# Patient Record
Sex: Female | Born: 1985 | Hispanic: No | Marital: Single | Smoking: Former smoker
Health system: Southern US, Community
[De-identification: ages and names within clinical notes are randomized; demographics above are authoritative.]

## PROBLEM LIST (undated history)

## (undated) ENCOUNTER — Inpatient Hospital Stay (HOSPITAL_COMMUNITY): Payer: Self-pay

## (undated) DIAGNOSIS — F329 Major depressive disorder, single episode, unspecified: Secondary | ICD-10-CM

## (undated) DIAGNOSIS — F32A Depression, unspecified: Secondary | ICD-10-CM

## (undated) DIAGNOSIS — Z789 Other specified health status: Secondary | ICD-10-CM

## (undated) DIAGNOSIS — O139 Gestational [pregnancy-induced] hypertension without significant proteinuria, unspecified trimester: Secondary | ICD-10-CM

## (undated) DIAGNOSIS — N6009 Solitary cyst of unspecified breast: Secondary | ICD-10-CM

## (undated) DIAGNOSIS — R87629 Unspecified abnormal cytological findings in specimens from vagina: Secondary | ICD-10-CM

## (undated) DIAGNOSIS — F419 Anxiety disorder, unspecified: Secondary | ICD-10-CM

## (undated) DIAGNOSIS — B999 Unspecified infectious disease: Secondary | ICD-10-CM

## (undated) HISTORY — DX: Unspecified abnormal cytological findings in specimens from vagina: R87.629

## (undated) HISTORY — PX: BREAST BIOPSY: SHX20

## (undated) HISTORY — PX: WISDOM TOOTH EXTRACTION: SHX21

## (undated) HISTORY — DX: Anxiety disorder, unspecified: F41.9

---

## 2005-10-11 DIAGNOSIS — O139 Gestational [pregnancy-induced] hypertension without significant proteinuria, unspecified trimester: Secondary | ICD-10-CM

## 2005-10-11 HISTORY — DX: Gestational (pregnancy-induced) hypertension without significant proteinuria, unspecified trimester: O13.9

## 2007-09-01 ENCOUNTER — Ambulatory Visit: Payer: Self-pay | Admitting: *Deleted

## 2007-09-02 ENCOUNTER — Inpatient Hospital Stay (HOSPITAL_COMMUNITY): Admission: RE | Admit: 2007-09-02 | Discharge: 2007-09-03 | Payer: Self-pay | Admitting: *Deleted

## 2011-02-23 NOTE — H&P (Signed)
Joyce Rivers NO.:  [id number]   MEDICAL RECORD NO.:  [id number]          PATIENT TYPE:  IPS   LOCATION:  0604                          FACILITY:  [facility]   PHYSICIAN:  Jasmine Pang, M.D. DATE OF BIRTH:  1986-07-23   DATE OF ADMISSION:  09/02/2007  DATE OF DISCHARGE:                       PSYCHIATRIC ADMISSION ASSESSMENT   IDENTIFYING INFORMATION:  This is an involuntary admission to the  services of Dr. Milford Rivers.  This is a 25 year old single [place]-  American female.  Apparently, she discovered that her baby's father is  actually seeing a friend of hers.  This caused her to feel a lot of  pain.  She impulsively overdosed.  She took 19 Tylenol PM.  Apparently  she disclosed this to the baby's father who took her on to the [facility].  She was medically cleared there.  Her initial  acetaminophen level was 64.5.  It came down to 58.6.  She was considered  to be medically cleared.  Her urine drug screen was negative for any  other drugs or substances.  She has no history for that, and she denies  feeling depressed ordinarily.  She states this was just an impulsive  response to the news that her baby's father having a different  relationship.  She would be interested in going to therapy.   SOCIAL HISTORY:  She quit school in 11th grade.  She is currently  enrolled in [employer].  She is taking courses for her GED.  She is also active  in the [employer] program.  She has a 24 year old son, and she lives with  her aunt.  She is close to her brothers and sisters and cousins.   FAMILY HISTORY:  She denies.   ALCOHOL AND DRUG ABUSE:  She denies.   PAST MEDICAL HISTORY:  Primary care Joyce Rivers is [facility].  Medical problems:  At age 21 and 69 she had breast biopsies that turned  out to be benign.  She has no other known health issues.   MEDICATIONS:  She is not prescribed any.   ALLERGIES:  No known drug allergies.   POSITIVE  PHYSICAL FINDINGS:  PHYSICAL EXAMINATION:  She was medically  cleared in the ED at [facility].  She had no positives on her review of  systems.  Vital signs on admission show that she is 67 inches tall,  weighs 214 pounds.  Her temperature is 97.3, blood pressure is 120/86,  pulse is 76-91, respirations are 20.  She had no other remarkable  physical findings.   MENTAL STATUS EXAM:  Today, she is alert and oriented x3.  She is  appropriately groomed, dressed and nourished. Her speech is a normal  rate, rhythm and tone.  Her mood is alright.  Her affect is normal  range.  Thought processes are clear, rational and goal oriented.  Judgment and insight are good.  Concentration and memory are intact.  Intelligence is at least average.  She denies being not suicidal or  homicidal.  She denies auditory or visual hallucinations.   ADMISSION DIAGNOSES:  AXIS I:  Depressive  disorder not otherwise  specified, adjust reaction with disorder of conduct.  AXIS II:  None.  AXIS III:  None.  AXIS IV:  Impulsive response  AXIS V:  Global assessment of function is 20.   PLAN:  Admit for safety and stabilization.  She denies any need for  antidepressants.  She is going to go to therapy.  She is requesting  discharge.  We will try to set up a family session to see if everyone is  in agreement and try to identify a therapist in her area.      Joyce Rivers, P.A.-C.      Jasmine Pang, M.D.  Electronically Signed    MD/MEDQ  D:  09/02/2007  T:  09/03/2007  Job:  [id number]

## 2011-02-26 NOTE — Discharge Summary (Signed)
Joyce Rivers, Joyce Rivers NO.:  [id number]   MEDICAL RECORD NO.:  [id number]          PATIENT TYPE:  IPS   LOCATION:  0604                          FACILITY:  [facility]   PHYSICIAN:  Geoffery Lyons, M.D.      DATE OF BIRTH:  11-03-1985   DATE OF ADMISSION:  09/02/2007  DATE OF DISCHARGE:  09/03/2007                               DISCHARGE SUMMARY   CHIEF COMPLAINT AND PRESENT ILLNESS:  This was the first admission to  [facility] for this 25 year old single African  American female.  She apparently discovered that her baby's father  actually was seeing a friend of hers.  This caused her to feel a lot of  pain, impulsively overdosed, took 19 Tylenol.  She disclosed this to the  baby's father who took her to the [facility].  She was  medically cleared.  She has no history of this behavior and denied  feeling depressed ordinarily.  She said that she was just impulsively  responding to the news that her baby's father is having a different  relationship.   PAST PSYCHIATRIC HISTORY:  Denies previous treatment.   ALCOHOL AND DRUG HISTORY:  Denies active use of any substances.   MEDICAL HISTORY:  Noncontributory.   MEDICATIONS:  None.   PHYSICAL EXAMINATION:  Performed, failed to show any acute findings.   LABORATORY WORKUP:  White blood cells 7.6, hemoglobin 12.9, mean  corpuscular volume 77.1.  Urine pregnancy test negative.  Drug screen  negative.  Sodium 138, potassium 3.4, glucose 96 creatinine 0.8, SGOT  20, SGPT 14, total bilirubin 0.6.   EXAMINATION:  Reveals alert, cooperative female, appropriately groomed,  dressed and nourished.  Speech was normal in rate, tempo and production.  Mood endorsed, she was feeling all right.  Affect was broad.  Thought  processes were logical, coherent and relevant.  No evidence of  delusions.  No active suicidal or homicidal ideas, no hallucinations.  Cognition was well-preserved.   ADMITTING  DIAGNOSES:  AXIS I:  Adjustment disorder with mixed emotional  features.  AXIS II:  No diagnosis.  AXIS III:  No diagnosis.  AXIS IV:  Moderate.  AXIS V:  Global Assessment of Functioning upon admission 50, highest GAF  in the last year, 70.   COURSE IN THE HOSPITAL:  She was admitted.  She was started in  individual and group psychotherapy.  No medications were started.  As  already stated, a 25 year old female who endorsed that she took an  overdose of Tylenol secondary to conflict with her boyfriend, her son's  father.  Endorsed she thought that he was seeing another female, took 70-  20 pills, says it was impulsive.  When she realized what happened, she  was lightheaded, called the boyfriend, and he took her to the hospital.  Endorsed that she was not depressed.  She was sort of upset.  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  Has a 9-year-old son.  Family not supportive because the boyfriend she claims is [place].  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  Now that she was not  going to be with the boyfriend, said that she will probably have to go  back to work.  On November 23 she was in full contact with reality.  There were no active suicidal or homicidal ideas, no hallucinations or  delusions.  Looking forward to going home, has school the following day.  Endorsed she would have nothing to do with the boyfriend.  Has no family  support to help her through this.   DISCHARGE DIAGNOSES:  AXIS I:  Adjustment disorder with mixed emotional  features.  AXIS II:  No diagnosis.  AXIS III:  No diagnosis.  AXIS IV:  Moderate.  AXIS V:  Global Assessment of Functioning on discharge 60.   Discharged on no medications.  She will be willing to see a counselor on  an outpatient basis.      Geoffery Lyons, M.D.  Electronically Signed     IL/MEDQ  D:  10/06/2007  T:  10/07/2007  Job:  [id number]

## 2015-03-10 ENCOUNTER — Encounter (HOSPITAL_COMMUNITY): Payer: Self-pay

## 2015-03-10 ENCOUNTER — Inpatient Hospital Stay (HOSPITAL_COMMUNITY)
Admission: AD | Admit: 2015-03-10 | Discharge: 2015-03-11 | Disposition: A | Discharge status: Home / Self Care | Payer: Medicaid - Out of State | Source: Ambulatory Visit | Attending: Obstetrics and Gynecology | Admitting: Obstetrics and Gynecology

## 2015-03-10 DIAGNOSIS — Z3A00 Weeks of gestation of pregnancy not specified: Secondary | ICD-10-CM | POA: Insufficient documentation

## 2015-03-10 DIAGNOSIS — O219 Vomiting of pregnancy, unspecified: Secondary | ICD-10-CM | POA: Insufficient documentation

## 2015-03-10 DIAGNOSIS — Z87891 Personal history of nicotine dependence: Secondary | ICD-10-CM | POA: Diagnosis not present

## 2015-03-10 HISTORY — DX: Major depressive disorder, single episode, unspecified: F32.9

## 2015-03-10 HISTORY — DX: Depression, unspecified: F32.A

## 2015-03-10 HISTORY — DX: Solitary cyst of unspecified breast: N60.09

## 2015-03-10 LAB — URINALYSIS, ROUTINE W REFLEX MICROSCOPIC
Glucose, UA: NEGATIVE mg/dL
Ketones, ur: >80 mg/dL — AB
Leukocytes, UA: NEGATIVE
Nitrite: NEGATIVE
PH: 6 (ref 5.0–8.0)
PROTEIN: 100 mg/dL — AB
UROBILINOGEN UA: 1 mg/dL (ref 0.0–1.0)

## 2015-03-10 LAB — URINE MICROSCOPIC-ADD ON

## 2015-03-10 LAB — POCT PREGNANCY, URINE: PREG TEST UR: POSITIVE — AB

## 2015-03-10 MED ORDER — PROMETHAZINE HCL 25 MG/ML IJ SOLN
25.0000 mg | Freq: Once | INTRAVENOUS | Status: AC
  Administered 2015-03-10: 25 mg via INTRAVENOUS
  Filled 2015-03-10: qty 1

## 2015-03-10 MED ORDER — LACTATED RINGERS IV BOLUS (SEPSIS)
1000.0000 mL | Freq: Once | INTRAVENOUS | Status: AC
  Administered 2015-03-10: 1000 mL via INTRAVENOUS

## 2015-03-10 MED ORDER — ONDANSETRON 8 MG PO TBDP: 8.0000 mg | ORAL_TABLET | Freq: Three times a day (TID) | ORAL | Status: DC | PRN

## 2015-03-10 MED ORDER — PROMETHAZINE HCL 25 MG PO TABS: 12.5000 mg | ORAL_TABLET | Freq: Four times a day (QID) | ORAL | Status: DC | PRN

## 2015-03-10 NOTE — Discharge Instructions (Signed)
Take all medications at regular intervals. One take Metoclopramide at bedtime You may insert the phenergan tablets into the vagina or rectum if you can't take a pill   Morning Sickness Morning sickness is when you feel sick to your stomach (nauseous) during pregnancy. This nauseous feeling may or may not come with vomiting. It often occurs in the morning but can be a problem any time of day. Morning sickness is most common during the first trimester, but it may continue throughout pregnancy. While morning sickness is unpleasant, it is usually harmless unless you develop severe and continual vomiting (hyperemesis gravidarum). This condition requires more intense treatment.  CAUSES  The cause of morning sickness is not completely known but seems to be related to normal hormonal changes that occur in pregnancy. RISK FACTORS You are at greater risk if you:  Experienced nausea or vomiting before your pregnancy.  Had morning sickness during a previous pregnancy.  Are pregnant with more than one baby, such as twins. TREATMENT  Do not use any medicines (prescription, over-the-counter, or herbal) for morning sickness without first talking to your health care provider. Your health care provider may prescribe or recommend:  Vitamin B6 supplements.  Anti-nausea medicines.  The herbal medicine ginger. HOME CARE INSTRUCTIONS   Only take over-the-counter or prescription medicines as directed by your health care provider.  Taking multivitamins before getting pregnant can prevent or decrease the severity of morning sickness in most women.  Eat a piece of dry toast or unsalted crackers before getting out of bed in the morning.  Eat five or six small meals a day.  Eat dry and bland foods (rice, baked potato). Foods high in carbohydrates are often helpful.  Do not drink liquids with your meals. Drink liquids between meals.  Avoid greasy, fatty, and spicy foods.  Get someone to cook for you if  the smell of any food causes nausea and vomiting.  If you feel nauseous after taking prenatal vitamins, take the vitamins at night or with a snack.  Snack on protein foods (nuts, yogurt, cheese) between meals if you are hungry.  Eat unsweetened gelatins for desserts.  Wearing an acupressure wristband (worn for sea sickness) may be helpful.  Acupuncture may be helpful.  Do not smoke.  Get a humidifier to keep the air in your house free of odors.  Get plenty of fresh air. SEEK MEDICAL CARE IF:   Your home remedies are not working, and you need medicine.  You feel dizzy or lightheaded.  You are losing weight. SEEK IMMEDIATE MEDICAL CARE IF:   You have persistent and uncontrolled nausea and vomiting.  You pass out (faint). MAKE SURE YOU:  Understand these instructions.  Will watch your condition.  Will get help right away if you are not doing well or get worse. Document Released: 11/18/2006 Document Revised: 10/02/2013 Document Reviewed: 03/14/2013 [facility] Patient Information 2015 [place], [place]. This information is not intended to replace advice given to you by your health care provider. Make sure you discuss any questions you have with your health care provider.  Safe Medications in Pregnancy   Acne: Benzoyl Peroxide Salicylic Acid  Backache/Headache: Tylenol: 2 regular strength every 4 hours OR              2 Extra strength every 6 hours  Colds/Coughs/Allergies: Benadryl (alcohol free) 25 mg every 6 hours as needed Breath right strips Claritin Cepacol throat lozenges Chloraseptic throat spray Cold-Eeze- up to three times per day Cough drops, alcohol free Flonase (  by prescription only) Guaifenesin Mucinex Robitussin DM (plain only, alcohol free) Saline nasal spray/drops Sudafed (pseudoephedrine) & Actifed ** use only after [redacted] weeks gestation and if you do not have high blood pressure Tylenol Vicks Vaporub Zinc lozenges Zyrtec    Constipation: Colace Ducolax suppositories Fleet enema Glycerin suppositories Metamucil Milk of magnesia Miralax Senokot Smooth move tea  Diarrhea: Kaopectate Imodium A-D  *NO pepto Bismol  Hemorrhoids: Anusol Anusol HC Preparation H Tucks  Indigestion: Tums Maalox Mylanta Zantac  Pepcid  Insomnia: Benadryl (alcohol free) 25mg  every 6 hours as needed Tylenol PM Unisom, no Gelcaps  Leg Cramps: Tums MagGel  Nausea/Vomiting:  Bonine Dramamine Emetrol Ginger extract Sea bands Meclizine  Nausea medication to take during pregnancy:  Unisom (doxylamine succinate 25 mg tablets) Take one tablet daily at bedtime. If symptoms are not adequately controlled, the dose can be increased to a maximum recommended dose of two tablets daily (1/2 tablet in the morning, 1/2 tablet mid-afternoon and one at bedtime). Vitamin B6 100mg  tablets. Take one tablet twice a day (up to 200 mg per day).  Skin Rashes: Aveeno products Benadryl cream or 25mg  every 6 hours as needed Calamine Lotion 1% cortisone cream  Yeast infection: Gyne-lotrimin 7 Monistat 7   **If taking multiple medications, please check labels to avoid duplicating the same active ingredients **take medication as directed on the label ** Do not exceed 4000 mg of tylenol in 24 hours **Do not take medications that contain aspirin or ibuprofen     Prenatal Care Providers [facility]  [phone]     Phone: [place]                      [facility]  @[place]     Phone: [phone]  Phone: [place]     Phone: [phone]  Phone: [facility] [place]                hone: [phone]  Phone: [phone]         [facility] [address]acy Racer      Phone:  [phone]  Phone: [phone]         [place] [facility]                Phone: [phone]  [place])          Phone: [phone] [facility]   Phone: [phone]

## 2015-03-10 NOTE — MAU Note (Signed)
Went to [facility] on last Friday and yesterday for vomiting and nausea prescription she was given is not working.  Took a home UPT , positive and seen at [facility].  Hurts to vomit and room mate thought there was blood in the vomit.  Very constipated.  Vomited several times, lost count.  Denies vaginal bleeding.  Not having cramps but having upper abd pain like a tummy ache.

## 2015-03-10 NOTE — MAU Provider Note (Signed)
History     CSN: [id number]  Arrival date and time: 03/10/15 2004   First Provider Initiated Contact with Patient 03/10/15 2121      Chief Complaint  Patient presents with  . Emesis During Pregnancy   HPI Comments: Joyce Rivers is a 29 y.o. [id number] at Unknown who presents today with nausea and vomiting. She states that this started about a week ago. She has been to [facility], and has been given zofran and phenergan. She states that these are not helping. She denies any vaginal bleeding or cramping.   Emesis  This is a new problem. The current episode started in the past 7 days. The problem occurs more than 10 times per day. The problem has been unchanged. The emesis has an appearance of stomach contents. There has been no fever. Pertinent negatives include no abdominal pain, diarrhea or fever. Risk factors: pregnancy  Treatments tried: zofran and reglan and another med, unk name. The treatment provided no relief.    Past Medical History  Diagnosis Date  . Cyst, breast   . Depression     Past Surgical History  Procedure Laterality Date  . Breast biopsy      No family history on file.  History  Substance Use Topics  . Smoking status: Former Smoker    Quit date: 02/10/2015  . Smokeless tobacco: Never Used  . Alcohol Use: No     Comment: not while pregnant    Allergies: No Known Allergies  Prescriptions prior to admission  Medication Sig Dispense Refill Last Dose  . metoCLOPramide (REGLAN) 10 MG tablet Take 10 mg by mouth every 6 (six) hours as needed for nausea or vomiting.   03/10/2015 at Unknown time  . ondansetron (ZOFRAN) 4 MG tablet Take 4 mg by mouth every 8 (eight) hours as needed for nausea or vomiting.   03/10/2015 at Unknown time  . Prenatal Vit-Fe Fumarate-FA (PRENATAL MULTIVITAMIN) TABS tablet Take 1 tablet by mouth daily at 12 noon.   03/10/2015 at Unknown time    Review of Systems  Constitutional: Negative for fever.  Gastrointestinal: Positive for  nausea, vomiting and constipation (no BM X 1 week ). Negative for abdominal pain and diarrhea.  Genitourinary: Negative for dysuria, urgency and frequency.   Physical Exam   Blood pressure 127/82, pulse 98, resp. rate 16, height 5\' 8"  (1.727 m), weight 91.853 kg (202 lb 8 oz), last menstrual period 01/30/2015, SpO2 99 %.  Physical Exam  Nursing note and vitals reviewed. Constitutional: She is oriented to person, place, and time. She appears well-developed and well-nourished. No distress.  Cardiovascular: Normal rate.   Respiratory: Effort normal.  GI: Soft. There is no tenderness.  Neurological: She is alert and oriented to person, place, and time.  Skin: Skin is warm and dry.  Psychiatric: She has a normal mood and affect.    MAU Course  Procedures  MDM Patient has had 1L D5LR with phenergan, and 1 L of LR. She has tolerated a PO challenge.   Assessment and Plan  , 1. Nausea/vomiting in pregnancy    DC home Comfort measures reviewed  1st Trimester precautions  RX: phenergan 25mg  #30, Zofran ODT 8mg  #30 Return to MAU as needed Start [facility] as soon as possible   Follow-up Information    Schedule an appointment as soon as possible for a visit with [facility] DEPT [place].   Contact information:   [place] [phone]  Mathis Bud 03/10/2015, 9:22 PM

## 2015-03-12 ENCOUNTER — Inpatient Hospital Stay (EMERGENCY_DEPARTMENT_HOSPITAL)
Admission: AD | Admit: 2015-03-12 | Discharge: 2015-03-13 | Disposition: A | Discharge status: Home / Self Care | Payer: Medicaid - Out of State | Source: Ambulatory Visit | Attending: Obstetrics & Gynecology | Admitting: Obstetrics & Gynecology

## 2015-03-12 ENCOUNTER — Encounter (HOSPITAL_COMMUNITY): Payer: Self-pay

## 2015-03-12 DIAGNOSIS — O219 Vomiting of pregnancy, unspecified: Secondary | ICD-10-CM

## 2015-03-12 DIAGNOSIS — F329 Major depressive disorder, single episode, unspecified: Secondary | ICD-10-CM | POA: Diagnosis present

## 2015-03-12 DIAGNOSIS — O211 Hyperemesis gravidarum with metabolic disturbance: Principal | ICD-10-CM | POA: Diagnosis present

## 2015-03-12 DIAGNOSIS — Z87891 Personal history of nicotine dependence: Secondary | ICD-10-CM

## 2015-03-12 DIAGNOSIS — Z3A01 Less than 8 weeks gestation of pregnancy: Secondary | ICD-10-CM | POA: Diagnosis present

## 2015-03-12 DIAGNOSIS — O99341 Other mental disorders complicating pregnancy, first trimester: Secondary | ICD-10-CM | POA: Diagnosis present

## 2015-03-12 LAB — URINALYSIS, ROUTINE W REFLEX MICROSCOPIC
BILIRUBIN URINE: NEGATIVE
Glucose, UA: NEGATIVE mg/dL
Leukocytes, UA: NEGATIVE
Nitrite: NEGATIVE
Protein, ur: 30 mg/dL — AB
Specific Gravity, Urine: >1.03 — ABNORMAL HIGH (ref 1.005–1.030)
UROBILINOGEN UA: 1 mg/dL (ref 0.0–1.0)
pH: 6 (ref 5.0–8.0)

## 2015-03-12 LAB — CBC WITH DIFFERENTIAL/PLATELET
Basophils Absolute: 0 10*3/uL (ref 0.0–0.1)
Basophils Relative: 0 % (ref 0–1)
EOS ABS: 0.1 10*3/uL (ref 0.0–0.7)
Eosinophils Relative: 1 % (ref 0–5)
HEMATOCRIT: 36.8 % (ref 36.0–46.0)
HEMOGLOBIN: 12.9 g/dL (ref 12.0–15.0)
Lymphocytes Relative: 25 % (ref 12–46)
Lymphs Abs: 2 10*3/uL (ref 0.7–4.0)
MCH: 28.2 pg (ref 26.0–34.0)
MCHC: 35.1 g/dL (ref 30.0–36.0)
MCV: 80.5 fL (ref 78.0–100.0)
Monocytes Absolute: 0.6 10*3/uL (ref 0.1–1.0)
Monocytes Relative: 8 % (ref 3–12)
NEUTROS PCT: 66 % (ref 43–77)
Neutro Abs: 5.5 10*3/uL (ref 1.7–7.7)
Platelets: 337 10*3/uL (ref 150–400)
RBC: 4.57 MIL/uL (ref 3.87–5.11)
RDW: 12.9 % (ref 11.5–15.5)
WBC: 8.1 10*3/uL (ref 4.0–10.5)

## 2015-03-12 LAB — COMPREHENSIVE METABOLIC PANEL
ALT: 12 U/L — ABNORMAL LOW (ref 14–54)
ANION GAP: 10 (ref 5–15)
AST: 18 U/L (ref 15–41)
Albumin: 3.9 g/dL (ref 3.5–5.0)
Alkaline Phosphatase: 57 U/L (ref 38–126)
BUN: 6 mg/dL (ref 6–20)
CALCIUM: 9.3 mg/dL (ref 8.9–10.3)
CHLORIDE: 100 mmol/L — AB (ref 101–111)
CO2: 25 mmol/L (ref 22–32)
Creatinine, Ser: 0.59 mg/dL (ref 0.44–1.00)
GFR calc Af Amer: >60 mL/min (ref >60)
GFR calc non Af Amer: >60 mL/min (ref >60)
Glucose, Bld: 90 mg/dL (ref 65–99)
Potassium: 3.1 mmol/L — ABNORMAL LOW (ref 3.5–5.1)
Sodium: 135 mmol/L (ref 135–145)
TOTAL PROTEIN: 7.4 g/dL (ref 6.5–8.1)
Total Bilirubin: 1 mg/dL (ref 0.3–1.2)

## 2015-03-12 LAB — URINE MICROSCOPIC-ADD ON

## 2015-03-12 LAB — LIPASE, BLOOD: Lipase: 23 U/L (ref 22–51)

## 2015-03-12 MED ORDER — FAMOTIDINE IN NACL 20-0.9 MG/50ML-% IV SOLN
20.0000 mg | Freq: Once | INTRAVENOUS | Status: AC
  Administered 2015-03-12: 20 mg via INTRAVENOUS
  Filled 2015-03-12: qty 50

## 2015-03-12 MED ORDER — SODIUM CHLORIDE 0.9 % IV SOLN
Freq: Once | INTRAVENOUS | Status: AC
  Administered 2015-03-12: 23:00:00 via INTRAVENOUS
  Filled 2015-03-12: qty 1000

## 2015-03-12 MED ORDER — PROMETHAZINE HCL 25 MG/ML IJ SOLN
25.0000 mg | Freq: Once | INTRAMUSCULAR | Status: AC
  Administered 2015-03-12: 25 mg via INTRAVENOUS
  Filled 2015-03-12: qty 1

## 2015-03-12 NOTE — MAU Provider Note (Signed)
History     CSN: [id number]  Arrival date and time: 03/12/15 1850   First Provider Initiated Contact with Patient 03/12/15 2019      Chief Complaint  Patient presents with  . Hyperemesis Gravidarum   HPI  [name] is a 29 y.o. [id number] at [redacted]w[redacted]d who presents to MAU today with complaint of N/V. The patient states that this has been going on since she found out she was pregnant. She had the same issues with her previous pregnancy. She has been taking Reglan, Phenergan and Zofran without relief. She also has heartburn associated. She denies abdominal pain, vaginal bleeding, abnormal discharge, vaginal itching, irritation or odor. She denies fever, diarrhea or sick contacts.   OB History    Gravida Para Term Preterm AB TAB SAB Ectopic Multiple Living   3    2  2   1       Past Medical History  Diagnosis Date  . Cyst, breast   . Depression     Past Surgical History  Procedure Laterality Date  . Breast biopsy      History reviewed. No pertinent family history.  History  Substance Use Topics  . Smoking status: Former Smoker    Quit date: 02/10/2015  . Smokeless tobacco: Never Used  . Alcohol Use: No     Comment: not while pregnant    Allergies: No Known Allergies  No prescriptions prior to admission    Review of Systems  Constitutional: Negative for fever and malaise/fatigue.  Gastrointestinal: Positive for nausea and vomiting. Negative for abdominal pain, diarrhea and constipation.  Genitourinary: Negative for dysuria, urgency and frequency.       Neg - vaginal bleeding, abnormal discharge   Physical Exam   Blood pressure 100/48, pulse 87, temperature 98 F (36.7 C), resp. rate 18, height 5\' 8"  (1.727 m), weight 204 lb (92.534 kg), last menstrual period 01/30/2015.  Physical Exam  Nursing note and vitals reviewed. Constitutional: She is oriented to person, place, and time. She appears well-developed and well-nourished. No distress.  HENT:  Head:  Normocephalic and atraumatic.  Cardiovascular: Normal rate.   Respiratory: Effort normal.  GI: Soft. She exhibits no distension and no mass. There is no tenderness. There is no rebound and no guarding.  Neurological: She is alert and oriented to person, place, and time.  Skin: Skin is warm and dry. No erythema.  Psychiatric: She has a normal mood and affect.   Results for orders placed or performed during the hospital encounter of 03/12/15 (from the past 24 hour(s))  Urinalysis, Routine w reflex microscopic (not at [facility])     Status: Abnormal   Collection Time: 03/12/15  6:00 PM  Result Value Ref Range   Color, Urine YELLOW YELLOW   APPearance CLEAR CLEAR   Specific Gravity, Urine >1.030 (H) 1.005 - 1.030   pH 6.0 5.0 - 8.0   Glucose, UA NEGATIVE NEGATIVE mg/dL   Hgb urine dipstick TRACE (A) NEGATIVE   Bilirubin Urine NEGATIVE NEGATIVE   Ketones, ur >80 (A) NEGATIVE mg/dL   Protein, ur 30 (A) NEGATIVE mg/dL   Urobilinogen, UA 1.0 0.0 - 1.0 mg/dL   Nitrite NEGATIVE NEGATIVE   Leukocytes, UA NEGATIVE NEGATIVE  Urine microscopic-add on     Status: None   Collection Time: 03/12/15  6:00 PM  Result Value Ref Range   Squamous Epithelial / LPF RARE RARE   WBC, UA 0-2 <3 WBC/hpf   RBC / HPF 3-6 <3 RBC/hpf  Bacteria, UA RARE RARE  CBC with Differential/Platelet     Status: None   Collection Time: 03/12/15  8:45 PM  Result Value Ref Range   WBC 8.1 4.0 - 10.5 K/uL   RBC 4.57 3.87 - 5.11 MIL/uL   Hemoglobin 12.9 12.0 - 15.0 g/dL   HCT 36.8 36.0 - 46.0 %   MCV 80.5 78.0 - 100.0 fL   MCH 28.2 26.0 - 34.0 pg   MCHC 35.1 30.0 - 36.0 g/dL   RDW 12.9 11.5 - 15.5 %   Platelets 337 150 - 400 K/uL   Neutrophils Relative % 66 43 - 77 %   Neutro Abs 5.5 1.7 - 7.7 K/uL   Lymphocytes Relative 25 12 - 46 %   Lymphs Abs 2.0 0.7 - 4.0 K/uL   Monocytes Relative 8 3 - 12 %   Monocytes Absolute 0.6 0.1 - 1.0 K/uL   Eosinophils Relative 1 0 - 5 %   Eosinophils Absolute 0.1 0.0 - 0.7 K/uL    Basophils Relative 0 0 - 1 %   Basophils Absolute 0.0 0.0 - 0.1 K/uL  Comprehensive metabolic panel     Status: Abnormal   Collection Time: 03/12/15  8:45 PM  Result Value Ref Range   Sodium 135 135 - 145 mmol/L   Potassium 3.1 (L) 3.5 - 5.1 mmol/L   Chloride 100 (L) 101 - 111 mmol/L   CO2 25 22 - 32 mmol/L   Glucose, Bld 90 65 - 99 mg/dL   BUN 6 6 - 20 mg/dL   Creatinine, Ser 0.59 0.44 - 1.00 mg/dL   Calcium 9.3 8.9 - 10.3 mg/dL   Total Protein 7.4 6.5 - 8.1 g/dL   Albumin 3.9 3.5 - 5.0 g/dL   AST 18 15 - 41 U/L   ALT 12 (L) 14 - 54 U/L   Alkaline Phosphatase 57 38 - 126 U/L   Total Bilirubin 1.0 0.3 - 1.2 mg/dL   GFR calc non Af Amer >60 >60 mL/min   GFR calc Af Amer >60 >60 mL/min   Anion gap 10 5 - 15  Lipase, blood     Status: None   Collection Time: 03/12/15  8:45 PM  Result Value Ref Range   Lipase 23 22 - 51 U/L    MAU Course  Procedures None  MDM UA today shows significant dehydration IV LR with 25 mg Phenergan infusion given CBC, CMP and Lipase ordered CMP shows mild hypokalemia, most likely from N/V. Will replace 10 mEq in second liter of fluid with MV.  Patient able to tolerate PO in MAU. No episodes of emesis witnessed.   Assessment and Plan  A: SIUP at [redacted]w[redacted]d Nausea and vomiting in pregnancy prior to [redacted] weeks gestation  P: Discharge home Rx for Phenergan suppositories and Pepcid given to patient Diet for N/V in pregnancy discussed First trimester warning signs discussed Patient advised to follow-up with OB provider of choice to start prenatal care Patient may return to MAU as needed or if her condition were to change or worsen   Luvenia Redden, PA-C  03/13/2015, 1:20 AM

## 2015-03-12 NOTE — MAU Note (Signed)
Pt presents complaining of nausea and vomiting. States she has been seen 4 times in the ED since Friday. States all they do is give IV fluids and send her home with medications that do not work. Also wants to know if her pregnancy is ok because "no one is telling her anything." Denies vaginal bleeding or discharge.

## 2015-03-13 DIAGNOSIS — Z3A01 Less than 8 weeks gestation of pregnancy: Secondary | ICD-10-CM

## 2015-03-13 DIAGNOSIS — O219 Vomiting of pregnancy, unspecified: Secondary | ICD-10-CM

## 2015-03-13 MED ORDER — FAMOTIDINE 20 MG PO TABS
20.0000 mg | ORAL_TABLET | Freq: Two times a day (BID) | ORAL | Status: DC
Start: 2015-03-13 — End: 2015-04-01

## 2015-03-13 MED ORDER — PROMETHAZINE HCL 25 MG RE SUPP: 25.0000 mg | Freq: Four times a day (QID) | RECTAL | Status: DC | PRN

## 2015-03-13 MED ORDER — METOCLOPRAMIDE HCL 5 MG/ML IJ SOLN
10.0000 mg | Freq: Once | INTRAMUSCULAR | Status: AC
  Administered 2015-03-13: 10 mg via INTRAVENOUS
  Filled 2015-03-13: qty 2

## 2015-03-13 NOTE — Discharge Instructions (Signed)
Morning Sickness °Morning sickness is when you feel sick to your stomach (nauseous) during pregnancy. You may feel sick to your stomach and throw up (vomit). You may feel sick in the morning, but you can feel this way any time of day. Some women feel very sick to their stomach and cannot stop throwing up (hyperemesis gravidarum). °HOME CARE °· Only take medicines as told by your doctor. °· Take multivitamins as told by your doctor. Taking multivitamins before getting pregnant can stop or lessen the harshness of morning sickness. °· Eat dry toast or unsalted crackers before getting out of bed. °· Eat 5 to 6 small meals a day. °· Eat dry and bland foods like rice and baked potatoes. °· Do not drink liquids with meals. Drink between meals. °· Do not eat greasy, fatty, or spicy foods. °· Have someone cook for you if the smell of food causes you to feel sick or throw up. °· If you feel sick to your stomach after taking prenatal vitamins, take them at night or with a snack. °· Eat protein when you need a snack (nuts, yogurt, cheese). °· Eat unsweetened gelatins for dessert. °· Wear a bracelet used for sea sickness (acupressure wristband). °· Go to a doctor that puts thin needles into certain body points (acupuncture) to improve how you feel. °· Do not smoke. °· Use a humidifier to keep the air in your house free of odors. °· Get lots of fresh air. °GET HELP IF: °· You need medicine to feel better. °· You feel dizzy or lightheaded. °· You are losing weight. °GET HELP RIGHT AWAY IF:  °· You feel very sick to your stomach and cannot stop throwing up. °· You pass out (faint). °MAKE SURE YOU: °· Understand these instructions. °· Will watch your condition. °· Will get help right away if you are not doing well or get worse. °Document Released: 11/04/2004 Document Revised: 10/02/2013 Document Reviewed: 03/14/2013 °ExitCare® Patient Information ©2015 [employer]. This information is not intended to replace advice given to you by  your health care provider. Make sure you discuss any questions you have with your health care provider. ° °Eating Plan for Hyperemesis Gravidarum °Severe cases of hyperemesis gravidarum can lead to dehydration and malnutrition. The hyperemesis eating plan is one way to lessen the symptoms of nausea and vomiting. It is often used with prescribed medicines to control your symptoms.  °WHAT CAN I DO TO RELIEVE MY SYMPTOMS? °Listen to your body. Everyone is different and has different preferences. Find what works best for you. Some of the following things may help: °· Eat and drink slowly. °· Eat 5-6 small meals daily instead of 3 large meals.   °· Eat crackers before you get out of bed in the morning.   °· Starchy foods are usually well tolerated (such as cereal, toast, bread, potatoes, pasta, rice, and pretzels).   °· Ginger may help with nausea. Add ¼ tsp ground ginger to hot tea or choose ginger tea.   °· Try drinking 100% fruit juice or an electrolyte drink. °· Continue to take your prenatal vitamins as directed by your health care provider. If you are having trouble taking your prenatal vitamins, talk with your health care provider about different options. °· Include at least 1 serving of protein with your meals and snacks (such as meats or poultry, beans, nuts, eggs, or yogurt). Try eating a protein-rich snack before bed (such as cheese and crackers or a half turkey or peanut butter sandwich). °WHAT THINGS SHOULD I   AVOID TO REDUCE MY SYMPTOMS? °The following things may help reduce your symptoms: °· Avoid foods with strong smells. Try eating meals in well-ventilated areas that are free of odors. °· Avoid drinking water or other beverages with meals. Try not to drink anything less than 30 minutes before and after meals. °· Avoid drinking more than 1 cup of fluid at a time. °· Avoid fried or high-fat foods, such as butter and cream sauces. °· Avoid spicy foods. °· Avoid skipping meals the best you can. Nausea can be  more intense on an empty stomach. If you cannot tolerate food at that time, do not force it. Try sucking on ice chips or other frozen items and make up the calories later. °· Avoid lying down within 2 hours after eating. °Document Released: 07/25/2007 Document Revised: 10/02/2013 Document Reviewed: 08/01/2013 °ExitCare® Patient Information ©2015 [employer]. This information is not intended to replace advice given to you by your health care provider. Make sure you discuss any questions you have with your health care provider. ° °

## 2015-03-15 ENCOUNTER — Encounter (HOSPITAL_COMMUNITY): Payer: Self-pay | Admitting: *Deleted

## 2015-03-15 ENCOUNTER — Inpatient Hospital Stay (HOSPITAL_COMMUNITY)
Admission: AD | Admit: 2015-03-15 | Discharge: 2015-03-17 | DRG: 781 | Disposition: A | Discharge status: Home / Self Care | Payer: Medicaid - Out of State | Source: Ambulatory Visit | Attending: Obstetrics and Gynecology | Admitting: Obstetrics and Gynecology

## 2015-03-15 DIAGNOSIS — F329 Major depressive disorder, single episode, unspecified: Secondary | ICD-10-CM | POA: Diagnosis not present

## 2015-03-15 DIAGNOSIS — Z87891 Personal history of nicotine dependence: Secondary | ICD-10-CM

## 2015-03-15 DIAGNOSIS — Z3A01 Less than 8 weeks gestation of pregnancy: Secondary | ICD-10-CM | POA: Diagnosis not present

## 2015-03-15 DIAGNOSIS — O211 Hyperemesis gravidarum with metabolic disturbance: Secondary | ICD-10-CM | POA: Diagnosis not present

## 2015-03-15 DIAGNOSIS — O99341 Other mental disorders complicating pregnancy, first trimester: Secondary | ICD-10-CM | POA: Diagnosis not present

## 2015-03-15 LAB — CBC WITH DIFFERENTIAL/PLATELET
Basophils Absolute: 0 10*3/uL (ref 0.0–0.1)
Basophils Relative: 0 % (ref 0–1)
EOS ABS: 0.1 10*3/uL (ref 0.0–0.7)
Eosinophils Relative: 1 % (ref 0–5)
HCT: 35 % — ABNORMAL LOW (ref 36.0–46.0)
HEMOGLOBIN: 12.3 g/dL (ref 12.0–15.0)
LYMPHS ABS: 1.6 10*3/uL (ref 0.7–4.0)
Lymphocytes Relative: 18 % (ref 12–46)
MCH: 28.3 pg (ref 26.0–34.0)
MCHC: 35.1 g/dL (ref 30.0–36.0)
MCV: 80.5 fL (ref 78.0–100.0)
Monocytes Absolute: 0.8 10*3/uL (ref 0.1–1.0)
Monocytes Relative: 8 % (ref 3–12)
NEUTROS PCT: 73 % (ref 43–77)
Neutro Abs: 6.7 10*3/uL (ref 1.7–7.7)
PLATELETS: 322 10*3/uL (ref 150–400)
RBC: 4.35 MIL/uL (ref 3.87–5.11)
RDW: 12.9 % (ref 11.5–15.5)
WBC: 9.1 10*3/uL (ref 4.0–10.5)

## 2015-03-15 LAB — URINALYSIS, ROUTINE W REFLEX MICROSCOPIC
Bilirubin Urine: NEGATIVE
GLUCOSE, UA: NEGATIVE mg/dL
Ketones, ur: >80 mg/dL — AB
Leukocytes, UA: NEGATIVE
Nitrite: NEGATIVE
PROTEIN: 30 mg/dL — AB
Specific Gravity, Urine: >1.03 — ABNORMAL HIGH (ref 1.005–1.030)
Urobilinogen, UA: 0.2 mg/dL (ref 0.0–1.0)
pH: 6 (ref 5.0–8.0)

## 2015-03-15 LAB — URINE MICROSCOPIC-ADD ON

## 2015-03-15 LAB — COMPREHENSIVE METABOLIC PANEL WITH GFR
ALT: 10 U/L — ABNORMAL LOW (ref 14–54)
AST: 16 U/L (ref 15–41)
Albumin: 3.7 g/dL (ref 3.5–5.0)
Alkaline Phosphatase: 52 U/L (ref 38–126)
Anion gap: 8 (ref 5–15)
BUN: 5 mg/dL — ABNORMAL LOW (ref 6–20)
CO2: 25 mmol/L (ref 22–32)
Calcium: 9.4 mg/dL (ref 8.9–10.3)
Chloride: 101 mmol/L (ref 101–111)
Creatinine, Ser: 0.58 mg/dL (ref 0.44–1.00)
GFR calc Af Amer: >60 mL/min
GFR calc non Af Amer: >60 mL/min
Glucose, Bld: 84 mg/dL (ref 65–99)
Potassium: 3.1 mmol/L — ABNORMAL LOW (ref 3.5–5.1)
Sodium: 134 mmol/L — ABNORMAL LOW (ref 135–145)
Total Bilirubin: 0.6 mg/dL (ref 0.3–1.2)
Total Protein: 7.4 g/dL (ref 6.5–8.1)

## 2015-03-15 MED ORDER — FAMOTIDINE IN NACL 20-0.9 MG/50ML-% IV SOLN
20.0000 mg | Freq: Once | INTRAVENOUS | Status: AC
  Administered 2015-03-15: 20 mg via INTRAVENOUS
  Filled 2015-03-15: qty 50

## 2015-03-15 MED ORDER — POTASSIUM CHLORIDE IN NACL 20-0.9 MEQ/L-% IV SOLN
INTRAVENOUS | Status: DC
  Filled 2015-03-15 (×2): qty 1000

## 2015-03-15 MED ORDER — PROMETHAZINE HCL 25 MG/ML IJ SOLN
25.0000 mg | Freq: Once | INTRAMUSCULAR | Status: AC
  Administered 2015-03-15: 25 mg via INTRAVENOUS
  Filled 2015-03-15: qty 1

## 2015-03-15 MED ORDER — METOCLOPRAMIDE HCL 10 MG PO TABS
10.0000 mg | ORAL_TABLET | Freq: Four times a day (QID) | ORAL | Status: DC | PRN
  Administered 2015-03-16 – 2015-03-17 (×3): 10 mg via ORAL
  Filled 2015-03-15 (×3): qty 1

## 2015-03-15 MED ORDER — PROMETHAZINE HCL 25 MG RE SUPP
25.0000 mg | Freq: Four times a day (QID) | RECTAL | Status: DC | PRN
  Administered 2015-03-16: 25 mg via RECTAL
  Filled 2015-03-15: qty 1

## 2015-03-15 MED ORDER — FAMOTIDINE 20 MG PO TABS
20.0000 mg | ORAL_TABLET | Freq: Two times a day (BID) | ORAL | Status: DC
  Administered 2015-03-16 – 2015-03-17 (×4): 20 mg via ORAL
  Filled 2015-03-15 (×5): qty 1

## 2015-03-15 MED ORDER — KCL IN DEXTROSE-NACL 20-5-0.45 MEQ/L-%-% IV SOLN
INTRAVENOUS | Status: DC
Start: 2015-03-16 — End: 2015-03-17
  Administered 2015-03-16 – 2015-03-17 (×6): via INTRAVENOUS
  Filled 2015-03-15 (×18): qty 1000

## 2015-03-15 MED ORDER — PRENATAL MULTIVITAMIN CH
1.0000 | ORAL_TABLET | Freq: Every day | ORAL | Status: DC
  Administered 2015-03-16 – 2015-03-17 (×2): 1 via ORAL
  Filled 2015-03-15 (×2): qty 1

## 2015-03-15 MED ORDER — ONDANSETRON 8 MG PO TBDP
8.0000 mg | ORAL_TABLET | Freq: Three times a day (TID) | ORAL | Status: DC | PRN
  Administered 2015-03-16 – 2015-03-17 (×3): 8 mg via ORAL
  Filled 2015-03-15 (×4): qty 1

## 2015-03-15 MED ORDER — POTASSIUM CHLORIDE IN NACL 20-0.9 MEQ/L-% IV SOLN
INTRAVENOUS | Status: AC
  Administered 2015-03-15: 22:00:00 via INTRAVENOUS
  Filled 2015-03-15 (×2): qty 1000

## 2015-03-15 NOTE — MAU Note (Signed)
Vomiting for 2 wks. Some blood in emesis. Lower abd cramps and burning in mid chest. NO BM in over a wk. No vag bleeding or d/c

## 2015-03-15 NOTE — H&P (Signed)
Joyce Rivers is an 29 y.o. female.  She is a [id number] Patient's last menstrual period was 01/30/2015. She is admitted for hyperemesis with dehydration and ketonuria , after her 6th ED visit this pregnancy for N&V.  She has been tried on zofran, diclegis, and po phenergan, and has NOT tried phenergan suppositories, allegedly due to high cost (insured Pt).  She has not identified a po food that she can tolerate. Weight loss by pt report is 14 pounds, not documented to date.  Pertinent Gynecological History: Menses: LmP 01/30/15 Bleeding: none  Contraception:  DES exposure: unknown Blood transfusions: none Sexually transmitted diseases: no past history Previous GYN Procedures:   Last mammogram:  Date:  Last pap:  Date:  OB History: G3, P0021   Menstrual History: Menarche age:  Patient's last menstrual period was 01/30/2015.    Past Medical History  Diagnosis Date  . Cyst, breast   . Depression     Past Surgical History  Procedure Laterality Date  . Breast biopsy      No family history on file.  Social History:  reports that she quit smoking about 4 weeks ago. She has never used smokeless tobacco. She reports that she does not drink alcohol or use illicit drugs.  Allergies: No Known Allergies  Prescriptions prior to admission  Medication Sig Dispense Refill Last Dose  . famotidine (PEPCID) 20 MG tablet Take 1 tablet (20 mg total) by mouth 2 (two) times daily. 30 tablet 0 Past Week at Unknown time  . metoCLOPramide (REGLAN) 10 MG tablet Take 10 mg by mouth every 6 (six) hours as needed for nausea or vomiting.   Past Week at Unknown time  . ondansetron (ZOFRAN) 4 MG tablet Take 4 mg by mouth every 8 (eight) hours as needed for nausea or vomiting.   Past Week at Unknown time  . promethazine (PHENERGAN) 25 MG tablet Take 0.5-1 tablets (12.5-25 mg total) by mouth every 6 (six) hours as needed. (Patient taking differently: Take 25 mg by mouth every 6 (six) hours as needed for  nausea. ) 30 tablet 0 Past Week at Unknown time  . ondansetron (ZOFRAN ODT) 8 MG disintegrating tablet Take 1 tablet (8 mg total) by mouth every 8 (eight) hours as needed for nausea or vomiting. (Patient not taking: Reported on 03/15/2015) 30 tablet 0 03/12/2015 at Unknown time  . promethazine (PHENERGAN) 25 MG suppository Place 1 suppository (25 mg total) rectally every 6 (six) hours as needed for nausea or vomiting. (Patient not taking: Reported on 03/15/2015) 12 each 0     ROS  Blood pressure 129/87, pulse 110, resp. rate 20, height 5\' 8"  (1.727 m), weight 197 lb 6.4 oz (89.54 kg), last menstrual period 01/30/2015. Physical Exam  Constitutional: She appears well-developed.  HENT:  Head: Normocephalic.  Eyes: Pupils are equal, round, and reactive to light.  Neck: Neck supple.  Cardiovascular: Normal rate.   Respiratory: Effort normal.  GI: Soft. She exhibits no distension.  Mild epigastric tenderness s/p N & V, no guarding or rebound.   Musculoskeletal: Normal range of motion.  Neurological: She is alert.  Skin: Skin is warm and dry.  Poor skin turgor    No results found for this or any previous visit (from the past 24 hour(s)).  No results found.  Assessment/Plan: Hyperemesis gravidarum  Pregnancy 6w 2 d Admit for IV fluids, rehydration, consider steroid taper. Try PR phenergan Joyce Rivers V 03/15/2015, 8:03 PM

## 2015-03-15 NOTE — MAU Provider Note (Signed)
History     CSN: [id number]  Arrival date and time: 03/15/15 7353   First Provider Initiated Contact with Patient 03/15/15 1927      Chief Complaint  Patient presents with  . Vomiting   HPI [name] is a 29 y.o. [id number] at [redacted]w[redacted]d who presents to MAU today with complaint of N/V. The patient has been seen numerous times already throughout the pregnancy with the same complaints. She is taking Reglan, phenergan, Zofran and Pepcid. She was given Rx for Phenergan suppositories at last visit, but states that she was unable to afford them. She states none of the medications are working and she continues to have frequent N/V and heartburn. She is unable to tolerate PO today. She denies diarrhea or fever.   OB History    Gravida Para Term Preterm AB TAB SAB Ectopic Multiple Living   3    2  2   1       Past Medical History  Diagnosis Date  . Cyst, breast   . Depression     Past Surgical History  Procedure Laterality Date  . Breast biopsy      No family history on file.  History  Substance Use Topics  . Smoking status: Former Smoker    Quit date: 02/10/2015  . Smokeless tobacco: Never Used  . Alcohol Use: No     Comment: not while pregnant    Allergies: No Known Allergies  Prescriptions prior to admission  Medication Sig Dispense Refill Last Dose  . famotidine (PEPCID) 20 MG tablet Take 1 tablet (20 mg total) by mouth 2 (two) times daily. 30 tablet 0 Past Week at Unknown time  . metoCLOPramide (REGLAN) 10 MG tablet Take 10 mg by mouth every 6 (six) hours as needed for nausea or vomiting.   Past Week at Unknown time  . ondansetron (ZOFRAN) 4 MG tablet Take 4 mg by mouth every 8 (eight) hours as needed for nausea or vomiting.   Past Week at Unknown time  . promethazine (PHENERGAN) 25 MG tablet Take 0.5-1 tablets (12.5-25 mg total) by mouth every 6 (six) hours as needed. (Patient taking differently: Take 25 mg by mouth every 6 (six) hours as needed for nausea. ) 30  tablet 0 Past Week at Unknown time  . ondansetron (ZOFRAN ODT) 8 MG disintegrating tablet Take 1 tablet (8 mg total) by mouth every 8 (eight) hours as needed for nausea or vomiting. (Patient not taking: Reported on 03/15/2015) 30 tablet 0 03/12/2015 at Unknown time  . promethazine (PHENERGAN) 25 MG suppository Place 1 suppository (25 mg total) rectally every 6 (six) hours as needed for nausea or vomiting. (Patient not taking: Reported on 03/15/2015) 12 each 0     Review of Systems  Constitutional: Negative for fever and malaise/fatigue.  Gastrointestinal: Positive for nausea and vomiting. Negative for abdominal pain, diarrhea and constipation.  Genitourinary:       Neg - vaginal bleeding, discharge   Physical Exam   Blood pressure 129/87, pulse 110, resp. rate 20, height 5\' 8"  (1.727 m), weight 197 lb 6.4 oz (89.54 kg), last menstrual period 01/30/2015.  Physical Exam  Nursing note and vitals reviewed. Constitutional: She is oriented to person, place, and time. She appears well-developed and well-nourished. No distress.  HENT:  Head: Normocephalic and atraumatic.  Cardiovascular: Normal rate.   Respiratory: Effort normal.  GI: Soft. She exhibits no distension and no mass. There is no tenderness. There is no rebound and no guarding.  Neurological: She is alert and oriented to person, place, and time.  Skin: Skin is warm and dry. No erythema.  Psychiatric: She has a normal mood and affect.    MAU Course  Procedures None  MDM UA today IV LR given with 25 mg Phenergan infusion and 20 mg IVPB CBC and CMP ordered Discussed patient with Dr. Glo Herring. Due to number of recent MAU visits and failed outpatient therapy, we will admit to Women's Unit to IV hydrate and given IV anti-emetics Dr. Glo Herring to MAU to discuss admission plan with the patient.  Assessment and Plan  A: Hyperemesis Gravidarum  P: Admit to Women's Unit for IV hydration and antiemetics  Luvenia Redden, PA-C  03/15/2015,  7:55 PM

## 2015-03-16 MED ORDER — POLYETHYLENE GLYCOL 3350 17 G PO PACK
17.0000 g | PACK | Freq: Once | ORAL | Status: AC
  Administered 2015-03-16: 17 g via ORAL
  Filled 2015-03-16: qty 1

## 2015-03-16 MED ORDER — SENNA 8.6 MG PO TABS
1.0000 | ORAL_TABLET | Freq: Every evening | ORAL | Status: DC | PRN
  Filled 2015-03-16: qty 1

## 2015-03-16 NOTE — Progress Notes (Signed)
Subjective: Day 2  Of hospitalization for Hyperemesis, with no vomiting so far since admit.  Patient reports taking po liquids well. Voided once since admit.    Objective: I have reviewed patient's vital signs, intake and output, medications and labs.  Intake/Output Summary (Last 24 hours) at 03/16/15 0710 Last data filed at 03/15/15 2157  Gross per 24 hour  Intake 1615.1 ml  Output    200 ml  Net 1415.1 ml  Temp:  [98.7 F (37.1 C)-99.3 F (37.4 C)] 98.7 F (37.1 C) (06/05 0538) Pulse Rate:  [81-110] 81 (06/05 0538) Resp:  [18-22] 18 (06/05 0538) BP: (100-129)/(54-87) 100/54 mmHg (06/05 0538) SpO2:  [100 %] 100 % (06/05 0538) Weight:  [197 lb 6.4 oz (89.54 kg)] 197 lb 6.4 oz (89.54 kg) (06/04 1915)   General: alert, cooperative and no distress Resp: unlabored GI: soft, non-tender; bowel sounds normal; no masses,  no organomegaly   Assessment/Plan: Nausea and vomiting in pregnancy, HG. Advance diet to full liquids  Continue reglan/zofran/add pr phenergan Keep one more day   LOS: 1 day    Chantrell Apsey V 03/16/2015, 7:09 AM

## 2015-03-16 NOTE — Plan of Care (Signed)
Problem: Phase II Progression Outcomes Goal: Adequate hydration Outcome: Completed/Met Date Met:  03/16/15 Voiding 600cc of clear yellow urine at a time.Urine was tea colored. Goal: Pain controlled Outcome: Completed/Met Date Met:  03/16/15 Has denied pain for the last 12 hours.Did c/o chest pain on admission but it was burning from vomiting. Goal: Progress activity as tolerated unless otherwise ordered Outcome: Completed/Met Date Met:  03/16/15 Has tolerated ambulating to and from bathroom. Goal: Tolerating diet Outcome: Completed/Met Date Met:  03/16/15 Reminded to eat a bland diet,make smart food choices and to try no to let her stomach get empty.Eat small snacks. Goal: Discharge plan established Outcome: Completed/Met Date Met:  03/16/15 Nausea and vomiting at a minimum Tolerating some Regular diet Maintain weight Understands f/u and when to call MD.

## 2015-03-17 DIAGNOSIS — O211 Hyperemesis gravidarum with metabolic disturbance: Principal | ICD-10-CM

## 2015-03-17 MED ORDER — PROMETHAZINE HCL 25 MG RE SUPP: 25.0000 mg | Freq: Four times a day (QID) | RECTAL | Status: DC | PRN

## 2015-03-17 MED ORDER — ONDANSETRON 8 MG PO TBDP: 8.0000 mg | ORAL_TABLET | Freq: Three times a day (TID) | ORAL | Status: DC | PRN

## 2015-03-17 NOTE — Discharge Summary (Signed)
Physician Discharge Summary  Patient ID: Joyce Rivers MRN: [id number] DOB/AGE: 1986-04-17 29 y.o.  Admit date: 03/15/2015 Discharge date: 03/17/2015  Admission Diagnoses:[redacted]w[redacted]d Hyperemesis gravidarum  Discharge Diagnoses:  Active Problems:   Hyperemesis gravidarum before end of [redacted] week gestation, dehydration   Discharged Condition: fair Joyce Rivers is an 29 y.o. female. She is a [id number] Patient's last menstrual period was 01/30/2015. She is admitted for hyperemesis with dehydration and ketonuria , after her 6th ED visit this pregnancy for N&V.  She has been tried on zofran, diclegis, and po phenergan, and has NOT tried phenergan suppositories, allegedly due to high cost (insured Pt).  She has not identified a po food that she can tolerate. Weight loss by pt report is 14 pounds, not documented to date.  Pertinent Gynecological History: Menses: LmP 01/30/15 Bleeding: none  Contraception:  DES exposure: unknown Blood transfusions: none Sexually transmitted diseases: no past history Previous GYN Procedures:  Last mammogram: Date:  Last pap: Date:  OB History: G3, P0021  Menstrual History: Menarche age:  Patient's last menstrual period was 01/30/2015.    Past Medical History  Diagnosis Date  . Cyst, breast   . Depression     Past Surgical History  Procedure Laterality Date  . Breast biopsy      No family history on file.  Social History:  reports that she quit smoking about 4 weeks ago. She has never used smokeless tobacco. She reports that she does not drink alcohol or use illicit drugs.  Allergies: No Known Allergies  Prescriptions prior to admission  Medication Sig Dispense Refill Last Dose  . famotidine (PEPCID) 20 MG tablet Take 1 tablet (20 mg total) by mouth 2 (two) times daily. 30 tablet 0 Past Week at Unknown time  . metoCLOPramide (REGLAN) 10 MG tablet Take 10 mg by mouth every 6 (six)  hours as needed for nausea or vomiting.   Past Week at Unknown time  . ondansetron (ZOFRAN) 4 MG tablet Take 4 mg by mouth every 8 (eight) hours as needed for nausea or vomiting.   Past Week at Unknown time  . promethazine (PHENERGAN) 25 MG tablet Take 0.5-1 tablets (12.5-25 mg total) by mouth every 6 (six) hours as needed. (Patient taking differently: Take 25 mg by mouth every 6 (six) hours as needed for nausea. ) 30 tablet 0 Past Week at Unknown time  . ondansetron (ZOFRAN ODT) 8 MG disintegrating tablet Take 1 tablet (8 mg total) by mouth every 8 (eight) hours as needed for nausea or vomiting. (Patient not taking: Reported on 03/15/2015) 30 tablet 0 03/12/2015 at Unknown time  . promethazine (PHENERGAN) 25 MG suppository Place 1 suppository (25 mg total) rectally every 6 (six) hours as needed for nausea or vomiting. (Patient not taking: Reported on 03/15/2015) 12 each 0     ROS  Blood pressure 129/87, pulse 110, resp. rate 20, height 5\' 8"  (1.727 m), weight 197 lb 6.4 oz (89.54 kg), last menstrual period 01/30/2015. Physical Exam  Constitutional: She appears well-developed.  HENT:  Head: Normocephalic.  Eyes: Pupils are equal, round, and reactive to light.  Neck: Neck supple.  Cardiovascular: Normal rate.  Respiratory: Effort normal.  GI: Soft. She exhibits no distension.  Mild epigastric tenderness s/p N & V, no guarding or rebound.  Musculoskeletal: Normal range of motion.  Neurological: She is alert.  Skin: Skin is warm and dry.  Poor skin turgor     Lab Results Last 24 Hours    No results  found for this or any previous visit (from the past 24 hour(s)).     Imaging Results (Last 48 hours)    No results found.    Assessment/Plan: Hyperemesis gravidarum  Pregnancy 6w 2 d Admit for IV fluids, rehydration, consider steroid taper. Try PR phenergan FERGUSON,JOHN V 03/15/2015, 8:03 PM      Hospital Course: Patient was tolerating PO and asks for  discharge today.  Consults: None  Significant Diagnostic Studies: labs:  CBC    Component Value Date/Time   WBC 9.1 03/15/2015 2025   RBC 4.35 03/15/2015 2025   HGB 12.3 03/15/2015 2025   HCT 35.0* 03/15/2015 2025   PLT 322 03/15/2015 2025   MCV 80.5 03/15/2015 2025   MCH 28.3 03/15/2015 2025   MCHC 35.1 03/15/2015 2025   RDW 12.9 03/15/2015 2025   LYMPHSABS 1.6 03/15/2015 2025   MONOABS 0.8 03/15/2015 2025   EOSABS 0.1 03/15/2015 2025   BASOSABS 0.0 03/15/2015 2025    CMP     Component Value Date/Time   NA 134* 03/15/2015 2025   K 3.1* 03/15/2015 2025   CL 101 03/15/2015 2025   CO2 25 03/15/2015 2025   GLUCOSE 84 03/15/2015 2025   BUN 5* 03/15/2015 2025   CREATININE 0.58 03/15/2015 2025   CALCIUM 9.4 03/15/2015 2025   PROT 7.4 03/15/2015 2025   ALBUMIN 3.7 03/15/2015 2025   AST 16 03/15/2015 2025   ALT 10* 03/15/2015 2025   ALKPHOS 52 03/15/2015 2025   BILITOT 0.6 03/15/2015 2025   GFRNONAA >60 03/15/2015 2025   GFRAA >60 03/15/2015 2025      Treatments: IV hydration and  Current facility-administered medications:  .  dextrose 5 % and 0.45 % NaCl with KCl 20 mEq/L infusion, , Intravenous, Continuous, Jonnie Kind, MD, Last Rate: 200 mL/hr at 03/17/15 0404 .  famotidine (PEPCID) tablet 20 mg, 20 mg, Oral, BID, Jonnie Kind, MD, 20 mg at 03/16/15 2303 .  metoCLOPramide (REGLAN) tablet 10 mg, 10 mg, Oral, Q6H PRN, Jonnie Kind, MD, 10 mg at 03/17/15 0721 .  ondansetron (ZOFRAN-ODT) disintegrating tablet 8 mg, 8 mg, Oral, Q8H PRN, Jonnie Kind, MD, 8 mg at 03/16/15 1626 .  prenatal multivitamin tablet 1 tablet, 1 tablet, Oral, Q1200, Jonnie Kind, MD, 1 tablet at 03/16/15 1201 .  promethazine (PHENERGAN) suppository 25 mg, 25 mg, Rectal, Q6H PRN, Jonnie Kind, MD, 25 mg at 03/16/15 0819 .  senna (SENOKOT) tablet 8.6 mg, 1 tablet, Oral, QHS PRN, Governor Specking, MD   Discharge Exam: Blood pressure 100/56, pulse 79, temperature 98.8 F (37.1 C),  temperature source Oral, resp. rate 15, height 5\' 8"  (1.727 m), weight 91.173 kg (201 lb), last menstrual period 01/30/2015, SpO2 100 %. General appearance: alert, cooperative and no distress GI: soft, non-tender; bowel sounds normal; no masses,  no organomegaly  Disposition: 01-Home or Self Care     Medication List    ASK your doctor about these medications        famotidine 20 MG tablet  Commonly known as:  PEPCID  Take 1 tablet (20 mg total) by mouth 2 (two) times daily.     metoCLOPramide 10 MG tablet  Commonly known as:  REGLAN  Take 10 mg by mouth every 6 (six) hours as needed for nausea or vomiting.     ondansetron 4 MG tablet  Commonly known as:  ZOFRAN  Take 4 mg by mouth every 8 (eight) hours as needed for nausea or  vomiting.     ondansetron 8 MG disintegrating tablet  Commonly known as:  ZOFRAN ODT  Take 1 tablet (8 mg total) by mouth every 8 (eight) hours as needed for nausea or vomiting.     promethazine 25 MG tablet  Commonly known as:  PHENERGAN  Take 0.5-1 tablets (12.5-25 mg total) by mouth every 6 (six) hours as needed.     promethazine 25 MG suppository  Commonly known as:  PHENERGAN  Place 1 suppository (25 mg total) rectally every 6 (six) hours as needed for nausea or vomiting.      f/u with [facility] or [facility] for prenatal care   Signed: ARNOLD,JAMES 03/17/2015, 7:43 AM

## 2015-03-17 NOTE — Discharge Instructions (Signed)
Hyperemesis Gravidarum °Hyperemesis gravidarum is a severe form of nausea and vomiting that happens during pregnancy. Hyperemesis is worse than morning sickness. It may cause you to have nausea or vomiting all day for many days. It may keep you from eating and drinking enough food and liquids. Hyperemesis usually occurs during the first half (the first 20 weeks) of pregnancy. It often goes away once a woman is in her second half of pregnancy. However, sometimes hyperemesis continues through an entire pregnancy.  °CAUSES  °The cause of this condition is not completely known but is thought to be related to changes in the body's hormones when pregnant. It could be from the high level of the pregnancy hormone or an increase in estrogen in the body.  °SIGNS AND SYMPTOMS  °· Severe nausea and vomiting. °· Nausea that does not go away. °· Vomiting that does not allow you to keep any food down. °· Weight loss and body fluid loss (dehydration). °· Having no desire to eat or not liking food you have previously enjoyed. °DIAGNOSIS  °Your health care provider will do a physical exam and ask you about your symptoms. He or she may also order blood tests and urine tests to make sure something else is not causing the problem.  °TREATMENT  °You may only need medicine to control the problem. If medicines do not control the nausea and vomiting, you will be treated in the hospital to prevent dehydration, increased acid in the blood (acidosis), weight loss, and changes in the electrolytes in your body that may harm the unborn baby (fetus). You may need IV fluids.  °HOME CARE INSTRUCTIONS  °· Only take over-the-counter or prescription medicines as directed by your health care provider. °· Try eating a couple of dry crackers or toast in the morning before getting out of bed. °· Avoid foods and smells that upset your stomach. °· Avoid fatty and spicy foods. °· Eat 5-6 small meals a day. °· Do not drink when eating meals. Drink between  meals. °· For snacks, eat high-protein foods, such as cheese. °· Eat or suck on things that have ginger in them. Ginger helps nausea. °· Avoid food preparation. The smell of food can spoil your appetite. °· Avoid iron pills and iron in your multivitamins until after 3-4 months of being pregnant. However, consult with your health care provider before stopping any prescribed iron pills. °SEEK MEDICAL CARE IF:  °· Your abdominal pain increases. °· You have a severe headache. °· You have vision problems. °· You are losing weight. °SEEK IMMEDIATE MEDICAL CARE IF:  °· You are unable to keep fluids down. °· You vomit blood. °· You have constant nausea and vomiting. °· You have excessive weakness. °· You have extreme thirst. °· You have dizziness or fainting. °· You have a fever or persistent symptoms for more than 2-3 days. °· You have a fever and your symptoms suddenly get worse. °MAKE SURE YOU:  °· Understand these instructions. °· Will watch your condition. °· Will get help right away if you are not doing well or get worse. °Document Released: 09/27/2005 Document Revised: 07/18/2013 Document Reviewed: 05/09/2013 °[employer]® Patient Information ©2015 [employer]. This information is not intended to replace advice given to you by your health care provider. Make sure you discuss any questions you have with your health care provider. ° °

## 2015-03-21 ENCOUNTER — Encounter (HOSPITAL_COMMUNITY): Payer: Self-pay

## 2015-03-21 ENCOUNTER — Inpatient Hospital Stay (HOSPITAL_COMMUNITY)
Admission: AD | Admit: 2015-03-21 | Discharge: 2015-03-21 | Disposition: A | Discharge status: Home / Self Care | Payer: Medicaid - Out of State | Source: Ambulatory Visit | Attending: Obstetrics & Gynecology | Admitting: Obstetrics & Gynecology

## 2015-03-21 DIAGNOSIS — Z3A01 Less than 8 weeks gestation of pregnancy: Secondary | ICD-10-CM | POA: Insufficient documentation

## 2015-03-21 DIAGNOSIS — Z87891 Personal history of nicotine dependence: Secondary | ICD-10-CM | POA: Diagnosis not present

## 2015-03-21 DIAGNOSIS — O210 Mild hyperemesis gravidarum: Secondary | ICD-10-CM | POA: Insufficient documentation

## 2015-03-21 LAB — URINALYSIS, ROUTINE W REFLEX MICROSCOPIC
GLUCOSE, UA: NEGATIVE mg/dL
Nitrite: NEGATIVE
PH: 6 (ref 5.0–8.0)
Protein, ur: 100 mg/dL — AB
Specific Gravity, Urine: >1.03 — ABNORMAL HIGH (ref 1.005–1.030)
Urobilinogen, UA: 1 mg/dL (ref 0.0–1.0)

## 2015-03-21 LAB — COMPREHENSIVE METABOLIC PANEL
ALBUMIN: 4.1 g/dL (ref 3.5–5.0)
ALK PHOS: 60 U/L (ref 38–126)
ALT: 12 U/L — AB (ref 14–54)
AST: 18 U/L (ref 15–41)
Anion gap: 11 (ref 5–15)
BUN: 6 mg/dL (ref 6–20)
CHLORIDE: 100 mmol/L — AB (ref 101–111)
CO2: 24 mmol/L (ref 22–32)
CREATININE: 0.61 mg/dL (ref 0.44–1.00)
Calcium: 9.9 mg/dL (ref 8.9–10.3)
GFR calc Af Amer: >60 mL/min (ref >60)
GFR calc non Af Amer: >60 mL/min (ref >60)
Glucose, Bld: 93 mg/dL (ref 65–99)
Potassium: 3.9 mmol/L (ref 3.5–5.1)
SODIUM: 135 mmol/L (ref 135–145)
TOTAL PROTEIN: 7.8 g/dL (ref 6.5–8.1)
Total Bilirubin: 0.5 mg/dL (ref 0.3–1.2)

## 2015-03-21 LAB — URINE MICROSCOPIC-ADD ON

## 2015-03-21 MED ORDER — PROMETHAZINE HCL 25 MG/ML IJ SOLN
25.0000 mg | Freq: Once | INTRAMUSCULAR | Status: AC
  Administered 2015-03-21: 25 mg via INTRAVENOUS
  Filled 2015-03-21: qty 1

## 2015-03-21 MED ORDER — M.V.I. ADULT IV INJ
Freq: Once | INTRAVENOUS | Status: AC
  Administered 2015-03-21: 14:00:00 via INTRAVENOUS
  Filled 2015-03-21: qty 10

## 2015-03-21 NOTE — MAU Provider Note (Signed)
History     CSN: [id number]  Arrival date and time: 03/21/15 1049   None     Chief Complaint  Patient presents with  . Emesis During Pregnancy   HPI Joyce Rivers is 29 y.o. [phone] [redacted]w[redacted]d weeks presenting with persistent nausea and vomiting in first trimester pregnancy.  She was hospitalized on 6/4 for 2 days for hydration.  Went home and did well for 1 day before sxs returned.  Phenergan, Reglan and Pepcid doesn't work.  She tried Phenergan supp in the hospital but didn't like those.  Can't afford them for home use.  Vomited "I can't keep up with how many times" today.  Saw a little blood this am.  States she has lost 17 lbs since first visit for N&V.   She has appt  in the clinic at [facility] to begin prenatal care.      Past Medical History  Diagnosis Date  . Cyst, breast   . Depression     Past Surgical History  Procedure Laterality Date  . Breast biopsy      History reviewed. No pertinent family history.  History  Substance Use Topics  . Smoking status: Former Smoker    Quit date: 02/10/2015  . Smokeless tobacco: Never Used  . Alcohol Use: No     Comment: not while pregnant    Allergies: No Known Allergies  Prescriptions prior to admission  Medication Sig Dispense Refill Last Dose  . famotidine (PEPCID) 20 MG tablet Take 1 tablet (20 mg total) by mouth 2 (two) times daily. 30 tablet 0 03/21/2015 at Unknown time  . metoCLOPramide (REGLAN) 10 MG tablet Take 10 mg by mouth every 6 (six) hours as needed for nausea or vomiting.   03/21/2015 at Unknown time  . promethazine (PHENERGAN) 25 MG tablet Take 0.5-1 tablets (12.5-25 mg total) by mouth every 6 (six) hours as needed. (Patient taking differently: Take 25 mg by mouth every 6 (six) hours as needed for nausea. ) 30 tablet 0 03/20/2015 at Unknown time  . ondansetron (ZOFRAN ODT) 8 MG disintegrating tablet Take 1 tablet (8 mg total) by mouth every 8 (eight) hours as needed for nausea or vomiting. (Patient not taking:  Reported on 03/21/2015) 30 tablet 0   . promethazine (PHENERGAN) 25 MG suppository Place 1 suppository (25 mg total) rectally every 6 (six) hours as needed for nausea, vomiting or refractory nausea / vomiting. (Patient not taking: Reported on 03/21/2015) 12 each 0     Review of Systems  Constitutional: Negative for fever and chills.  Gastrointestinal: Positive for nausea and vomiting. Negative for abdominal pain.  Genitourinary: Negative for dysuria, urgency, frequency and hematuria.       Neg for vaginal bleeding  Neurological: Negative for headaches.   Physical Exam   Blood pressure 127/81, pulse 102, temperature 98.3 F (36.8 C), temperature source Oral, resp. rate 18, weight 197 lb (89.359 kg), last menstrual period 01/30/2015.  Physical Exam  Nursing note and vitals reviewed. Constitutional: She is oriented to person, place, and time. She appears well-developed and well-nourished. No distress.  Looks like she doesn't feel well.  HENT:  Head: Normocephalic.  Neck: Normal range of motion.  Cardiovascular: Normal rate.   Respiratory: Effort normal.  GI: Soft. She exhibits no distension and no mass. There is no tenderness. There is no rebound and no guarding.  Genitourinary:  Pelvic exam not indicated for today's complaint  Neurological: She is alert and oriented to person, place, and time.  Skin:  Skin is warm and dry.  Psychiatric: She has a normal mood and affect. Her behavior is normal.   Results for orders placed or performed during the hospital encounter of 03/21/15 (from the past 24 hour(s))  Urinalysis, Routine w reflex microscopic (not at [facility])     Status: Abnormal   Collection Time: 03/21/15 11:05 AM  Result Value Ref Range   Color, Urine YELLOW YELLOW   APPearance HAZY (A) CLEAR   Specific Gravity, Urine >1.030 (H) 1.005 - 1.030   pH 6.0 5.0 - 8.0   Glucose, UA NEGATIVE NEGATIVE mg/dL   Hgb urine dipstick LARGE (A) NEGATIVE   Bilirubin Urine SMALL (A) NEGATIVE    Ketones, ur >80 (A) NEGATIVE mg/dL   Protein, ur 100 (A) NEGATIVE mg/dL   Urobilinogen, UA 1.0 0.0 - 1.0 mg/dL   Nitrite NEGATIVE NEGATIVE   Leukocytes, UA TRACE (A) NEGATIVE  Urine microscopic-add on     Status: Abnormal   Collection Time: 03/21/15 11:05 AM  Result Value Ref Range   Squamous Epithelial / LPF RARE RARE   WBC, UA 3-6 <3 WBC/hpf   RBC / HPF 21-50 <3 RBC/hpf   Bacteria, UA MANY (A) RARE   Urine-Other MUCOUS PRESENT   Comprehensive metabolic panel     Status: Abnormal   Collection Time: 03/21/15  1:00 PM  Result Value Ref Range   Sodium 135 135 - 145 mmol/L   Potassium 3.9 3.5 - 5.1 mmol/L   Chloride 100 (L) 101 - 111 mmol/L   CO2 24 22 - 32 mmol/L   Glucose, Bld 93 65 - 99 mg/dL   BUN 6 6 - 20 mg/dL   Creatinine, Ser 0.61 0.44 - 1.00 mg/dL   Calcium 9.9 8.9 - 10.3 mg/dL   Total Protein 7.8 6.5 - 8.1 g/dL   Albumin 4.1 3.5 - 5.0 g/dL   AST 18 15 - 41 U/L   ALT 12 (L) 14 - 54 U/L   Alkaline Phosphatase 60 38 - 126 U/L   Total Bilirubin 0.5 0.3 - 1.2 mg/dL   GFR calc non Af Amer >60 >60 mL/min   GFR calc Af Amer >60 >60 mL/min   Anion gap 11 5 - 15   MAU Course  Procedures  MDM Labs IV Hydration with Meds--D5LR with Phenergan 25mg  IV 13:45  1st liter almost infused.  Patient is feeling better.  Will give 2nd liter with Multivits.  Try crackers.  1 liter of LR with Multivitamins infused  Patient declined crackers stating she felt better but wasn't hungry.   Ready for discharge  Assessment and Plan  A:  Hyperemesis in first trimester pregnancy      Weight loss with hyperemesis  P:  Discharge to home       Discussed OTC Vit B6 and Unisom      She also has Rx for Zofran she was given that she has not filled to take if OTC meds are not helpful     Keeps scheduled appt in the clinic  KEY,EVE M 03/21/2015, 1:35 PM

## 2015-03-21 NOTE — MAU Note (Signed)
Really sick today.  Took medication this morning, but it didn't stay down.

## 2015-03-21 NOTE — Discharge Instructions (Signed)
Eating Plan for Hyperemesis Gravidarum Severe cases of hyperemesis gravidarum can lead to dehydration and malnutrition. The hyperemesis eating plan is one way to lessen the symptoms of nausea and vomiting. It is often used with prescribed medicines to control your symptoms.  WHAT CAN I DO TO RELIEVE MY SYMPTOMS? Listen to your body. Everyone is different and has different preferences. Find what works best for you. Some of the following things may help:  Eat and drink slowly.  Eat 5-6 small meals daily instead of 3 large meals.   Eat crackers before you get out of bed in the morning.   Starchy foods are usually well tolerated (such as cereal, toast, bread, potatoes, pasta, rice, and pretzels).   Ginger may help with nausea. Add  tsp ground ginger to hot tea or choose ginger tea.   Try drinking 100% fruit juice or an electrolyte drink.  Continue to take your prenatal vitamins as directed by your health care provider. If you are having trouble taking your prenatal vitamins, talk with your health care provider about different options.  Include at least 1 serving of protein with your meals and snacks (such as meats or poultry, beans, nuts, eggs, or yogurt). Try eating a protein-rich snack before bed (such as cheese and crackers or a half [place] or peanut butter sandwich). WHAT THINGS SHOULD I AVOID TO REDUCE MY SYMPTOMS? The following things may help reduce your symptoms:  Avoid foods with strong smells. Try eating meals in well-ventilated areas that are free of odors.  Avoid drinking water or other beverages with meals. Try not to drink anything less than 30 minutes before and after meals.  Avoid drinking more than 1 cup of fluid at a time.  Avoid fried or high-fat foods, such as butter and cream sauces.  Avoid spicy foods.  Avoid skipping meals the best you can. Nausea can be more intense on an empty stomach. If you cannot tolerate food at that time, do not force it. Try sucking on  ice chips or other frozen items and make up the calories later.  Avoid lying down within 2 hours after eating. Document Released: 07/25/2007 Document Revised: 10/02/2013 Document Reviewed: 08/01/2013 [facility] Patient Information 2015 [place], [place]. This information is not intended to replace advice given to you by your health care provider. Make sure you discuss any questions you have with your health care provider.  Hyperemesis Gravidarum Hyperemesis gravidarum is a severe form of nausea and vomiting that happens during pregnancy. Hyperemesis is worse than morning sickness. It may cause you to have nausea or vomiting all day for many days. It may keep you from eating and drinking enough food and liquids. Hyperemesis usually occurs during the first half (the first 20 weeks) of pregnancy. It often goes away once a woman is in her second half of pregnancy. However, sometimes hyperemesis continues through an entire pregnancy.  CAUSES  The cause of this condition is not completely known but is thought to be related to changes in the body's hormones when pregnant. It could be from the high level of the pregnancy hormone or an increase in estrogen in the body.  SIGNS AND SYMPTOMS   Severe nausea and vomiting.  Nausea that does not go away.  Vomiting that does not allow you to keep any food down.  Weight loss and body fluid loss (dehydration).  Having no desire to eat or not liking food you have previously enjoyed. DIAGNOSIS  Your health care provider will do a physical exam  and ask you about your symptoms. He or she may also order blood tests and urine tests to make sure something else is not causing the problem.  TREATMENT  You may only need medicine to control the problem. If medicines do not control the nausea and vomiting, you will be treated in the hospital to prevent dehydration, increased acid in the blood (acidosis), weight loss, and changes in the electrolytes in your body that may harm  the unborn baby (fetus). You may need IV fluids.  HOME CARE INSTRUCTIONS   Only take over-the-counter or prescription medicines as directed by your health care provider.  Try eating a couple of dry crackers or toast in the morning before getting out of bed.  Avoid foods and smells that upset your stomach.  Avoid fatty and spicy foods.  Eat 5-6 small meals a day.  Do not drink when eating meals. Drink between meals.  For snacks, eat high-protein foods, such as cheese.  Eat or suck on things that have ginger in them. Ginger helps nausea.  Avoid food preparation. The smell of food can spoil your appetite.  Avoid iron pills and iron in your multivitamins until after 3-4 months of being pregnant. However, consult with your health care provider before stopping any prescribed iron pills. SEEK MEDICAL CARE IF:   Your abdominal pain increases.  You have a severe headache.  You have vision problems.  You are losing weight. SEEK IMMEDIATE MEDICAL CARE IF:   You are unable to keep fluids down.  You vomit blood.  You have constant nausea and vomiting.  You have excessive weakness.  You have extreme thirst.  You have dizziness or fainting.  You have a fever or persistent symptoms for more than 2-3 days.  You have a fever and your symptoms suddenly get worse. MAKE SURE YOU:   Understand these instructions.  Will watch your condition.  Will get help right away if you are not doing well or get worse. Document Released: 09/27/2005 Document Revised: 07/18/2013 Document Reviewed: 05/09/2013 [facility] Patient Information 2015 [place], [place]. This information is not intended to replace advice given to you by your health care provider. Make sure you discuss any questions you have with your health care provider. Safe Medications in Pregnancy   Acne:  Benzoyl Peroxide  Salicylic Acid   Backache/Headache:  Tylenol: 2 regular strength every 4 hours OR        2 Extra  strength every 6 hours   Colds/Coughs/Allergies:  Benadryl (alcohol free) 25 mg every 6 hours as needed  Breath right strips  Claritin  Cepacol throat lozenges  Chloraseptic throat spray  Cold-Eeze- up to three times per day  Cough drops, alcohol free  Flonase (by prescription only)  Guaifenesin  Mucinex  Robitussin DM (plain only, alcohol free)  Saline nasal spray/drops  Sudafed (pseudoephedrine) & Actifed * use only after [redacted] weeks gestation and if you do not have high blood pressure  Tylenol  Vicks Vaporub  Zinc lozenges  Zyrtec   Constipation:  Colace  Ducolax suppositories  Fleet enema  Glycerin suppositories  Metamucil  Milk of magnesia  Miralax  Senokot  Smooth move tea   Diarrhea:  Kaopectate  Imodium A-D   *NO pepto Bismol   Hemorrhoids:  Anusol  Anusol HC  Preparation H  Tucks   Indigestion:  Tums  Maalox  Mylanta  Zantac  Pepcid   Insomnia:  Benadryl (alcohol free) 25mg  every 6 hours as needed  Tylenol PM  Unisom, no  Gelcaps   Leg Cramps:  Tums  MagGel   Nausea/Vomiting:  Bonine  Dramamine  Emetrol  Ginger extract  Sea bands  Meclizine  Nausea medication to take during pregnancy:  Unisom (doxylamine succinate 25 mg tablets) Take one tablet daily at bedtime. If symptoms are not adequately controlled, the dose can be increased to a maximum recommended dose of two tablets daily (1/2 tablet in the morning, 1/2 tablet mid-afternoon and one at bedtime).  Vitamin B6 100mg  tablets. Take one tablet twice a day (up to 200 mg per day).   Skin Rashes:  Aveeno products  Benadryl cream or 25mg  every 6 hours as needed  Calamine Lotion  1% cortisone cream   Yeast infection:  Gyne-lotrimin 7  Monistat 7    **If taking multiple medications, please check labels to avoid duplicating the same active ingredients  **take medication as directed on the label  ** Do not exceed 4000 mg of tylenol in 24 hours  **Do not take medications that contain  aspirin or ibuprofen

## 2015-03-24 ENCOUNTER — Inpatient Hospital Stay (HOSPITAL_COMMUNITY)
Admission: AD | Admit: 2015-03-24 | Discharge: 2015-03-25 | Disposition: A | Discharge status: Home / Self Care | Payer: Medicaid Other | Source: Ambulatory Visit | Attending: Obstetrics and Gynecology | Admitting: Obstetrics and Gynecology

## 2015-03-24 ENCOUNTER — Encounter (HOSPITAL_COMMUNITY): Payer: Self-pay

## 2015-03-24 DIAGNOSIS — O211 Hyperemesis gravidarum with metabolic disturbance: Secondary | ICD-10-CM

## 2015-03-24 DIAGNOSIS — O218 Other vomiting complicating pregnancy: Secondary | ICD-10-CM | POA: Diagnosis not present

## 2015-03-24 DIAGNOSIS — O210 Mild hyperemesis gravidarum: Secondary | ICD-10-CM | POA: Insufficient documentation

## 2015-03-24 DIAGNOSIS — Z3A01 Less than 8 weeks gestation of pregnancy: Secondary | ICD-10-CM | POA: Insufficient documentation

## 2015-03-24 DIAGNOSIS — Z87891 Personal history of nicotine dependence: Secondary | ICD-10-CM | POA: Diagnosis not present

## 2015-03-24 HISTORY — DX: Gestational (pregnancy-induced) hypertension without significant proteinuria, unspecified trimester: O13.9

## 2015-03-24 LAB — URINALYSIS, ROUTINE W REFLEX MICROSCOPIC
Glucose, UA: NEGATIVE mg/dL
Ketones, ur: >80 mg/dL — AB
Nitrite: NEGATIVE
PROTEIN: 100 mg/dL — AB
Specific Gravity, Urine: >1.03 — ABNORMAL HIGH (ref 1.005–1.030)
UROBILINOGEN UA: 1 mg/dL (ref 0.0–1.0)
pH: 6 (ref 5.0–8.0)

## 2015-03-24 LAB — URINE MICROSCOPIC-ADD ON

## 2015-03-24 MED ORDER — LACTATED RINGERS IV BOLUS (SEPSIS)
1000.0000 mL | Freq: Once | INTRAVENOUS | Status: AC
  Administered 2015-03-24: 1000 mL via INTRAVENOUS

## 2015-03-24 MED ORDER — ONDANSETRON 8 MG PO TBDP
8.0000 mg | ORAL_TABLET | Freq: Once | ORAL | Status: AC
  Administered 2015-03-24: 8 mg via ORAL
  Filled 2015-03-24: qty 1

## 2015-03-24 MED ORDER — PROMETHAZINE HCL 25 MG/ML IJ SOLN
25.0000 mg | Freq: Once | INTRAVENOUS | Status: AC
  Administered 2015-03-24: 25 mg via INTRAVENOUS
  Filled 2015-03-24: qty 1

## 2015-03-24 NOTE — MAU Provider Note (Signed)
History     CSN: [id number]  Arrival date and time: 03/24/15 2059   First Provider Initiated Contact with Patient 03/24/15 2211      Chief Complaint  Patient presents with  . Hyperemesis Gravidarum   HPI Comments: Joyce Rivers is a 29 y.o. [phone] at [redacted]w[redacted]d who presents today with nausea and vomiting. She states that this has been ongoing since she found out she was pregnant. She has tried reglan, phenergan and diclegis. She has also tried zofran ODT. She states that the ODT works for her, but she was given non-ODT zofran. She states that she is unable to keep that down.   Emesis  This is a new problem. The current episode started more than 1 month ago. The problem occurs more than 10 times per day. The problem has been unchanged. The emesis has an appearance of stomach contents. There has been no fever. Pertinent negatives include no abdominal pain, diarrhea or fever. Risk factors: pregnant. Treatments tried: phenergan, reglan and diclegis     Past Medical History  Diagnosis Date  . Cyst, breast   . Depression   . Pregnancy induced hypertension 2007    Past Surgical History  Procedure Laterality Date  . Breast biopsy      History reviewed. No pertinent family history.  History  Substance Use Topics  . Smoking status: Former Smoker    Quit date: 02/10/2015  . Smokeless tobacco: Never Used  . Alcohol Use: No     Comment: not while pregnant    Allergies: No Known Allergies  Prescriptions prior to admission  Medication Sig Dispense Refill Last Dose  . Doxylamine Succinate, Sleep, (UNISOM PO) Take 1 tablet by mouth 2 (two) times daily as needed (take 1/2 tablet in the morning and 1/2 tablet before bedtime.).   03/23/2015 at Unknown time  . famotidine (PEPCID) 20 MG tablet Take 1 tablet (20 mg total) by mouth 2 (two) times daily. 30 tablet 0 03/23/2015 at Unknown time  . metoCLOPramide (REGLAN) 10 MG tablet Take 10 mg by mouth every 6 (six) hours as needed for nausea or  vomiting.   03/24/2015 at Unknown time  . promethazine (PHENERGAN) 25 MG tablet Take 0.5-1 tablets (12.5-25 mg total) by mouth every 6 (six) hours as needed. (Patient taking differently: Take 25 mg by mouth every 6 (six) hours as needed for nausea. ) 30 tablet 0 Past Week at Unknown time  . pyridoxine (B-6) 200 MG tablet Take 100 mg by mouth 2 (two) times daily.   03/24/2015 at Unknown time    Review of Systems  Constitutional: Negative for fever.  Gastrointestinal: Positive for nausea and vomiting. Negative for abdominal pain, diarrhea and constipation.  Genitourinary: Negative for dysuria, urgency and frequency.   Physical Exam   Blood pressure 127/79, pulse 105, temperature 98.6 F (37 C), temperature source Oral, resp. rate 18, height 5\' 8"  (1.727 m), weight 89.449 kg (197 lb 3.2 oz), last menstrual period 01/30/2015, SpO2 100 %.  Physical Exam  Nursing note and vitals reviewed. Constitutional: She is oriented to person, place, and time. She appears well-developed and well-nourished. No distress.  HENT:  Head: Normocephalic.  Cardiovascular: Normal rate.   Respiratory: Effort normal.  GI: Soft. There is no tenderness. There is no rebound.  Neurological: She is alert and oriented to person, place, and time.  Skin: Skin is warm and dry.  Psychiatric: She has a normal mood and affect.    Results for orders placed or performed during  the hospital encounter of 03/24/15 (from the past 48 hour(s))  Urinalysis, Routine w reflex microscopic (not at [facility])     Status: Abnormal   Collection Time: 03/24/15  9:19 PM  Result Value Ref Range   Color, Urine YELLOW YELLOW   APPearance TURBID (A) CLEAR   Specific Gravity, Urine >1.030 (H) 1.005 - 1.030   pH 6.0 5.0 - 8.0   Glucose, UA NEGATIVE NEGATIVE mg/dL   Hgb urine dipstick LARGE (A) NEGATIVE   Bilirubin Urine SMALL (A) NEGATIVE   Ketones, ur >80 (A) NEGATIVE mg/dL   Protein, ur 100 (A) NEGATIVE mg/dL   Urobilinogen, UA 1.0 0.0 - 1.0  mg/dL   Nitrite NEGATIVE NEGATIVE   Leukocytes, UA TRACE (A) NEGATIVE  Urine microscopic-add on     Status: Abnormal   Collection Time: 03/24/15  9:19 PM  Result Value Ref Range   Squamous Epithelial / LPF FEW (A) RARE   WBC, UA 0-2 <3 WBC/hpf   RBC / HPF TOO NUMEROUS TO COUNT <3 RBC/hpf   Bacteria, UA FEW (A) RARE   Urine-Other MUCOUS PRESENT     MAU Course  Procedures  MDM Patient has had 1L of D5LR with phenergan and 1L of LR. She is tolerating PO at this time.   Assessment and Plan   1. Hyperemesis gravidarum before end of [redacted] week gestation, dehydration    DC home Comfort measures reviewed  1st Trimester precautions  Bleeding precautions RX: zofran ODT, Robinul Return to MAU as needed FU with OB as planned  Follow-up Information    Schedule an appointment as soon as possible for a visit with [facility] [place].   Contact information:   [place] [address] [phone]      Mathis Bud 03/24/2015, 10:12 PM

## 2015-03-24 NOTE — MAU Note (Signed)
Has not stopped vomiting since being in MAU the other day. Has lost count of how many times she has vomited in the last 24 hours. Unable to keep down liquid or solids. Denies vaginal bleeding. Denies lower abdominal pain. Some upper abdominal cramping.  Denies diarrhea. Denies fever.

## 2015-03-24 NOTE — MAU Note (Signed)
Pt reports she was given zofran ODT in early pregnancy. She has the oral zofran now which she says does not help her.  She finds better relief from the ODT zofran.  She could not keep down the po phenergan and said she wasn't informed that the pill could be inserted the vagina.

## 2015-03-25 DIAGNOSIS — O218 Other vomiting complicating pregnancy: Secondary | ICD-10-CM

## 2015-03-25 DIAGNOSIS — Z3A01 Less than 8 weeks gestation of pregnancy: Secondary | ICD-10-CM

## 2015-03-25 MED ORDER — ONDANSETRON 8 MG PO TBDP
8.0000 mg | ORAL_TABLET | Freq: Three times a day (TID) | ORAL | Status: DC | PRN
Start: 2015-03-25 — End: 2015-04-01

## 2015-03-25 MED ORDER — GLYCOPYRROLATE 1 MG PO TABS: 1.0000 mg | ORAL_TABLET | Freq: Two times a day (BID) | ORAL | Status: DC

## 2015-03-25 NOTE — Discharge Instructions (Signed)
Eating Plan for Hyperemesis Gravidarum Severe cases of hyperemesis gravidarum can lead to dehydration and malnutrition. The hyperemesis eating plan is one way to lessen the symptoms of nausea and vomiting. It is often used with prescribed medicines to control your symptoms.  WHAT CAN I DO TO RELIEVE MY SYMPTOMS? Listen to your body. Everyone is different and has different preferences. Find what works best for you. Some of the following things may help:  Eat and drink slowly.  Eat 5-6 small meals daily instead of 3 large meals.   Eat crackers before you get out of bed in the morning.   Starchy foods are usually well tolerated (such as cereal, toast, bread, potatoes, pasta, rice, and pretzels).   Ginger may help with nausea. Add  tsp ground ginger to hot tea or choose ginger tea.   Try drinking 100% fruit juice or an electrolyte drink.  Continue to take your prenatal vitamins as directed by your health care provider. If you are having trouble taking your prenatal vitamins, talk with your health care provider about different options.  Include at least 1 serving of protein with your meals and snacks (such as meats or poultry, beans, nuts, eggs, or yogurt). Try eating a protein-rich snack before bed (such as cheese and crackers or a half [place] or peanut butter sandwich). WHAT THINGS SHOULD I AVOID TO REDUCE MY SYMPTOMS? The following things may help reduce your symptoms:  Avoid foods with strong smells. Try eating meals in well-ventilated areas that are free of odors.  Avoid drinking water or other beverages with meals. Try not to drink anything less than 30 minutes before and after meals.  Avoid drinking more than 1 cup of fluid at a time.  Avoid fried or high-fat foods, such as butter and cream sauces.  Avoid spicy foods.  Avoid skipping meals the best you can. Nausea can be more intense on an empty stomach. If you cannot tolerate food at that time, do not force it. Try sucking on  ice chips or other frozen items and make up the calories later.  Avoid lying down within 2 hours after eating. Document Released: 07/25/2007 Document Revised: 10/02/2013 Document Reviewed: 08/01/2013 [facility] Patient Information 2015 [place], [place]. This information is not intended to replace advice given to you by your health care provider. Make sure you discuss any questions you have with your health care provider.

## 2015-03-31 ENCOUNTER — Encounter (HOSPITAL_COMMUNITY): Payer: Self-pay | Admitting: *Deleted

## 2015-03-31 ENCOUNTER — Inpatient Hospital Stay (HOSPITAL_COMMUNITY)
Admission: AD | Admit: 2015-03-31 | Discharge: 2015-03-31 | Disposition: A | Discharge status: Home / Self Care | Payer: Medicaid Other | Source: Ambulatory Visit | Attending: Obstetrics and Gynecology | Admitting: Obstetrics and Gynecology

## 2015-03-31 DIAGNOSIS — Z87891 Personal history of nicotine dependence: Secondary | ICD-10-CM | POA: Insufficient documentation

## 2015-03-31 DIAGNOSIS — Z3A08 8 weeks gestation of pregnancy: Secondary | ICD-10-CM | POA: Insufficient documentation

## 2015-03-31 DIAGNOSIS — O211 Hyperemesis gravidarum with metabolic disturbance: Secondary | ICD-10-CM | POA: Diagnosis not present

## 2015-03-31 DIAGNOSIS — O210 Mild hyperemesis gravidarum: Secondary | ICD-10-CM | POA: Insufficient documentation

## 2015-03-31 HISTORY — DX: Other specified health status: Z78.9

## 2015-03-31 LAB — URINALYSIS, ROUTINE W REFLEX MICROSCOPIC
Glucose, UA: NEGATIVE mg/dL
Ketones, ur: >80 mg/dL — AB
Nitrite: NEGATIVE
PH: 6.5 (ref 5.0–8.0)
PROTEIN: 30 mg/dL — AB
Specific Gravity, Urine: >1.03 — ABNORMAL HIGH (ref 1.005–1.030)
Urobilinogen, UA: 2 mg/dL — ABNORMAL HIGH (ref 0.0–1.0)

## 2015-03-31 LAB — URINE MICROSCOPIC-ADD ON

## 2015-03-31 MED ORDER — GI COCKTAIL ~~LOC~~
30.0000 mL | Freq: Once | ORAL | Status: AC
  Administered 2015-03-31: 30 mL via ORAL
  Filled 2015-03-31: qty 30

## 2015-03-31 MED ORDER — SODIUM CHLORIDE 0.9 % IV SOLN
25.0000 mg | Freq: Once | INTRAVENOUS | Status: AC
  Administered 2015-03-31: 25 mg via INTRAVENOUS
  Filled 2015-03-31: qty 1

## 2015-03-31 MED ORDER — DEXTROSE 5 % IN LACTATED RINGERS IV BOLUS
1000.0000 mL | Freq: Once | INTRAVENOUS | Status: AC
  Administered 2015-03-31: 1000 mL via INTRAVENOUS

## 2015-03-31 MED ORDER — SODIUM CHLORIDE 0.9 % IV SOLN
8.0000 mg | Freq: Once | INTRAVENOUS | Status: AC
  Administered 2015-03-31: 8 mg via INTRAVENOUS
  Filled 2015-03-31: qty 4

## 2015-03-31 NOTE — MAU Note (Signed)
Was here last wk, has not been able to keep any food or liquids down since Saturday.  Has been in several times for hyperemesis.  Today has just been really bad.

## 2015-03-31 NOTE — MAU Provider Note (Signed)
History     CSN: [id number]  Arrival date and time: 03/31/15 1330   First Provider Initiated Contact with Patient 03/31/15 1348      Chief Complaint  Patient presents with  . Emesis During Pregnancy   HPI  Patient is 28 y.o. [id number] [redacted]w[redacted]d here with complaints of nausea and vomiting.  Worsening since Saturday, has lost count of how much she has vomited or daily how often she vomits.  Nothing improves.  Currently taking zofran ODT and pepcid, takes phenergan at night qHS.   Past Medical History  Diagnosis Date  . Cyst, breast   . Depression   . Pregnancy induced hypertension 2007  . Medical history non-contributory     Past Surgical History  Procedure Laterality Date  . Breast biopsy      Family History  Problem Relation Age of Onset  . Asthma Sister   . Asthma Son   . Cancer Paternal Grandmother     breast  . Heart disease Paternal Grandmother   . Diabetes Neg Hx   . Hypertension Neg Hx     History  Substance Use Topics  . Smoking status: Former Smoker    Quit date: 02/10/2015  . Smokeless tobacco: Never Used  . Alcohol Use: No     Comment: not while pregnant    Allergies: No Known Allergies  Prescriptions prior to admission  Medication Sig Dispense Refill Last Dose  . Doxylamine Succinate, Sleep, (UNISOM PO) Take 1 tablet by mouth 2 (two) times daily as needed (take 1/2 tablet in the morning and 1/2 tablet before bedtime.).   03/23/2015 at Unknown time  . famotidine (PEPCID) 20 MG tablet Take 1 tablet (20 mg total) by mouth 2 (two) times daily. 30 tablet 0 03/23/2015 at Unknown time  . glycopyrrolate (ROBINUL) 1 MG tablet Take 1 tablet (1 mg total) by mouth 2 (two) times daily. 60 tablet 0   . metoCLOPramide (REGLAN) 10 MG tablet Take 10 mg by mouth every 6 (six) hours as needed for nausea or vomiting.   03/24/2015 at Unknown time  . ondansetron (ZOFRAN ODT) 8 MG disintegrating tablet Take 1 tablet (8 mg total) by mouth every 8 (eight) hours as needed for  nausea or vomiting. 20 tablet 2   . promethazine (PHENERGAN) 25 MG tablet Take 0.5-1 tablets (12.5-25 mg total) by mouth every 6 (six) hours as needed. (Patient taking differently: Take 25 mg by mouth every 6 (six) hours as needed for nausea. ) 30 tablet 0 Past Week at Unknown time  . pyridoxine (B-6) 200 MG tablet Take 100 mg by mouth 2 (two) times daily.   03/24/2015 at Unknown time    Review of Systems  Constitutional: Negative for fever, chills and diaphoresis.  HENT: Negative for congestion.   Respiratory: Negative for cough and shortness of breath.   Cardiovascular: Negative for chest pain (after vomiting, none exertional) and leg swelling.  Gastrointestinal: Positive for nausea and vomiting. Negative for heartburn and diarrhea.  Genitourinary: Negative for dysuria, urgency, frequency and hematuria.  Skin: Negative for itching and rash.  Neurological: Negative for dizziness, loss of consciousness and headaches.   Physical Exam   Blood pressure 123/77, pulse 111, temperature 98.6 F (37 C), temperature source Oral, resp. rate 18, weight 196 lb (88.905 kg), last menstrual period 01/30/2015.  Physical Exam  MAU Course  Procedures  MDM Peripheral IV Phenergan infusion zofran 8mg  IV PO challenge  F/u at 3:16 PM => still receiving phenergan infusion, s/p GI cocktail  and zofran 8mg  IV => feeling somewhat better  F/u 4:16 PM => symptoms imprved s/p all above.    Assessment and Plan  Patient is 29 y.o. [id number] [redacted]w[redacted]d reporting nausea and vomiting likely secondary to hyperemesis gravidarum  - sono as outpatient given no pain, has not had TSH either.  Will need at initial OB, already has appointment. - tolerating PO prior to discharge.  Kapil Petropoulos ROCIO 03/31/2015, 1:49 PM

## 2015-04-01 ENCOUNTER — Inpatient Hospital Stay (HOSPITAL_COMMUNITY)
Admission: AD | Admit: 2015-04-01 | Discharge: 2015-04-01 | Disposition: A | Discharge status: Home / Self Care | Payer: Medicaid Other | Source: Ambulatory Visit | Attending: Family Medicine | Admitting: Family Medicine

## 2015-04-01 ENCOUNTER — Encounter (HOSPITAL_COMMUNITY): Payer: Self-pay | Admitting: *Deleted

## 2015-04-01 DIAGNOSIS — O99281 Endocrine, nutritional and metabolic diseases complicating pregnancy, first trimester: Secondary | ICD-10-CM | POA: Diagnosis not present

## 2015-04-01 DIAGNOSIS — O210 Mild hyperemesis gravidarum: Secondary | ICD-10-CM | POA: Insufficient documentation

## 2015-04-01 DIAGNOSIS — E0590 Thyrotoxicosis, unspecified without thyrotoxic crisis or storm: Secondary | ICD-10-CM | POA: Insufficient documentation

## 2015-04-01 DIAGNOSIS — Z3A08 8 weeks gestation of pregnancy: Secondary | ICD-10-CM | POA: Diagnosis not present

## 2015-04-01 DIAGNOSIS — O211 Hyperemesis gravidarum with metabolic disturbance: Secondary | ICD-10-CM | POA: Diagnosis not present

## 2015-04-01 DIAGNOSIS — E079 Disorder of thyroid, unspecified: Secondary | ICD-10-CM | POA: Diagnosis not present

## 2015-04-01 LAB — COMPREHENSIVE METABOLIC PANEL
ALT: 11 U/L — ABNORMAL LOW (ref 14–54)
AST: 16 U/L (ref 15–41)
Albumin: 3.5 g/dL (ref 3.5–5.0)
Alkaline Phosphatase: 52 U/L (ref 38–126)
Anion gap: 7 (ref 5–15)
BILIRUBIN TOTAL: 0.5 mg/dL (ref 0.3–1.2)
CO2: 22 mmol/L (ref 22–32)
CREATININE: 0.46 mg/dL (ref 0.44–1.00)
Calcium: 9.1 mg/dL (ref 8.9–10.3)
Chloride: 105 mmol/L (ref 101–111)
GFR calc Af Amer: >60 mL/min (ref >60)
Glucose, Bld: 90 mg/dL (ref 65–99)
Potassium: 3.4 mmol/L — ABNORMAL LOW (ref 3.5–5.1)
Sodium: 134 mmol/L — ABNORMAL LOW (ref 135–145)
Total Protein: 7 g/dL (ref 6.5–8.1)

## 2015-04-01 LAB — CBC
HEMATOCRIT: 33.2 % — AB (ref 36.0–46.0)
Hemoglobin: 11.4 g/dL — ABNORMAL LOW (ref 12.0–15.0)
MCH: 27.5 pg (ref 26.0–34.0)
MCHC: 34.3 g/dL (ref 30.0–36.0)
MCV: 80.2 fL (ref 78.0–100.0)
PLATELETS: 323 10*3/uL (ref 150–400)
RBC: 4.14 MIL/uL (ref 3.87–5.11)
RDW: 13.3 % (ref 11.5–15.5)
WBC: 6.2 10*3/uL (ref 4.0–10.5)

## 2015-04-01 LAB — URINALYSIS, ROUTINE W REFLEX MICROSCOPIC
Bilirubin Urine: NEGATIVE
Glucose, UA: NEGATIVE mg/dL
Ketones, ur: >80 mg/dL — AB
NITRITE: NEGATIVE
Protein, ur: 30 mg/dL — AB
SPECIFIC GRAVITY, URINE: 1.02 (ref 1.005–1.030)
UROBILINOGEN UA: 2 mg/dL — AB (ref 0.0–1.0)
pH: 8 (ref 5.0–8.0)

## 2015-04-01 LAB — URINE MICROSCOPIC-ADD ON

## 2015-04-01 LAB — TSH: TSH: 0.157 u[IU]/mL — ABNORMAL LOW (ref 0.350–4.500)

## 2015-04-01 LAB — T4, FREE: Free T4: 1.02 ng/dL (ref 0.61–1.12)

## 2015-04-01 MED ORDER — SODIUM CHLORIDE 0.9 % IV SOLN
8.0000 mg | Freq: Once | INTRAVENOUS | Status: AC
  Administered 2015-04-01: 8 mg via INTRAVENOUS
  Filled 2015-04-01: qty 4

## 2015-04-01 MED ORDER — FAMOTIDINE 20 MG PO TABS: 20.0000 mg | ORAL_TABLET | Freq: Two times a day (BID) | ORAL | Status: DC

## 2015-04-01 MED ORDER — DEXTROSE 5 % IN LACTATED RINGERS IV BOLUS
1000.0000 mL | Freq: Once | INTRAVENOUS | Status: AC
  Administered 2015-04-01: 1000 mL via INTRAVENOUS

## 2015-04-01 MED ORDER — PROMETHAZINE HCL 25 MG PO TABS
25.0000 mg | ORAL_TABLET | Freq: Four times a day (QID) | ORAL | Status: DC | PRN
Start: 2015-04-01 — End: 2015-04-16

## 2015-04-01 MED ORDER — ONDANSETRON 8 MG PO TBDP
8.0000 mg | ORAL_TABLET | Freq: Three times a day (TID) | ORAL | Status: DC | PRN
Start: 2015-04-01 — End: 2015-04-16

## 2015-04-01 MED ORDER — PROMETHAZINE HCL 25 MG RE SUPP: 25.0000 mg | Freq: Four times a day (QID) | RECTAL | Status: DC | PRN

## 2015-04-01 MED ORDER — PROMETHAZINE HCL 25 MG/ML IJ SOLN
25.0000 mg | Freq: Once | INTRAVENOUS | Status: AC
  Administered 2015-04-01: 25 mg via INTRAVENOUS
  Filled 2015-04-01: qty 1

## 2015-04-01 NOTE — MAU Note (Signed)
Pt presents to MAU with complaints of nausea and vomiting, not able to keep anything down. Has been given zofran but states it is not working

## 2015-04-01 NOTE — Discharge Instructions (Signed)
°  Take Phenergan and Zofran on schedule. Do not wait until the nausea and vomiting have returned before you start taking her medicines again.   Eating Plan for Hyperemesis Gravidarum Severe cases of hyperemesis gravidarum can lead to dehydration and malnutrition. The hyperemesis eating plan is one way to lessen the symptoms of nausea and vomiting. It is often used with prescribed medicines to control your symptoms.  WHAT CAN I DO TO RELIEVE MY SYMPTOMS? Listen to your body. Everyone is different and has different preferences. Find what works best for you. Some of the following things may help:  Eat and drink slowly.  Eat 5-6 small meals daily instead of 3 large meals.   Eat crackers before you get out of bed in the morning.   Starchy foods are usually well tolerated (such as cereal, toast, bread, potatoes, pasta, rice, and pretzels).   Ginger may help with nausea. Add  tsp ground ginger to hot tea or choose ginger tea.   Try drinking 100% fruit juice or an electrolyte drink.  Continue to take your prenatal vitamins as directed by your health care provider. If you are having trouble taking your prenatal vitamins, talk with your health care provider about different options.  Include at least 1 serving of protein with your meals and snacks (such as meats or poultry, beans, nuts, eggs, or yogurt). Try eating a protein-rich snack before bed (such as cheese and crackers or a half [place] or peanut butter sandwich). WHAT THINGS SHOULD I AVOID TO REDUCE MY SYMPTOMS? The following things may help reduce your symptoms:  Avoid foods with strong smells. Try eating meals in well-ventilated areas that are free of odors.  Avoid drinking water or other beverages with meals. Try not to drink anything less than 30 minutes before and after meals.  Avoid drinking more than 1 cup of fluid at a time.  Avoid fried or high-fat foods, such as butter and cream sauces.  Avoid spicy foods.  Avoid skipping  meals the best you can. Nausea can be more intense on an empty stomach. If you cannot tolerate food at that time, do not force it. Try sucking on ice chips or other frozen items and make up the calories later.  Avoid lying down within 2 hours after eating. Document Released: 07/25/2007 Document Revised: 10/02/2013 Document Reviewed: 08/01/2013 [facility] Patient Information 2015 [place], [place]. This information is not intended to replace advice given to you by your health care provider. Make sure you discuss any questions you have with your health care provider.

## 2015-04-02 LAB — T3: T3 TOTAL: 179 ng/dL (ref 71–180)

## 2015-04-02 NOTE — MAU Provider Note (Signed)
Chief Complaint: Morning Sickness   First Provider Initiated Contact with Patient 04/01/15 1131     SUBJECTIVE HPI: Joyce Rivers is a 29 y.o. [id number] at [redacted]w[redacted]d by LMP who presents to [facility] reporting exacerbation of hyperemesis. Has vomited 6 x in the past 24 hours. Hasn't ben able to keep anything down. Took a Phenergan tablet and a zofran, but vomited immediately after. Has phenergan suppositories , but doesn't want to use them because she doesn't like how they feel. Has also tried Reglan w/out improvement. Has gained 2 lb since MAU visit yesterday. Denies sick contacts, fever, chills, diarrhea.   Has NOB visit scheduled in [place] on 04/17/15.   Past Medical History  Diagnosis Date  . Cyst, breast   . Depression   . Pregnancy induced hypertension 2007  . Medical history non-contributory    OB History  Gravida Para Term Preterm AB SAB TAB Ectopic Multiple Living  4 1 1  2 2    1     # Outcome Date GA Lbr Len/2nd Weight Sex Delivery Anes PTL Lv  4 Current           3 Term      Vag-Spont     2 SAB           1 SAB              Past Surgical History  Procedure Laterality Date  . Breast biopsy     History   Social History  . Marital Status: Single    Spouse Name: N/A  . Number of Children: N/A  . Years of Education: N/A   Occupational History  . Not on file.   Social History Main Topics  . Smoking status: Former Smoker    Quit date: 02/10/2015  . Smokeless tobacco: Never Used  . Alcohol Use: No     Comment: not while pregnant  . Drug Use: No  . Sexual Activity: Yes    Birth Control/ Protection: None   Other Topics Concern  . Not on file   Social History Narrative   No current facility-administered medications on file prior to encounter.   Current Outpatient Prescriptions on File Prior to Encounter  Medication Sig Dispense Refill  . Doxylamine Succinate, Sleep, (UNISOM PO) Take 1 tablet by mouth 2 (two) times daily as needed (take 1/2 tablet in the  morning and 1/2 tablet before bedtime.).    Marland Kitchen glycopyrrolate (ROBINUL) 1 MG tablet Take 1 tablet (1 mg total) by mouth 2 (two) times daily. 60 tablet 0  . metoCLOPramide (REGLAN) 10 MG tablet Take 10 mg by mouth every 6 (six) hours as needed for nausea or vomiting.    . pyridoxine (B-6) 200 MG tablet Take 100 mg by mouth 2 (two) times daily.     No Known Allergies  Review of Systems  Constitutional: Negative for fever and chills.  Gastrointestinal: Positive for heartburn, nausea and vomiting. Negative for abdominal pain, diarrhea and constipation.  Genitourinary: Negative for dysuria, urgency, frequency, hematuria and flank pain.       Neg for VB.  Neurological: Positive for weakness.   OBJECTIVE Blood pressure 124/68, pulse 83, temperature 98.5 F (36.9 C), resp. rate 18, height 5\' 8"  (1.727 m), weight 198 lb (89.812 kg), last menstrual period 01/30/2015. GENERAL: Well-developed, well-nourished female in no acute distress.  HEAD: Mucus membranes moist, No thyromegaly. HEART: normal rate RESP: normal effort GI: Abdomen soft, non-tender. MS: Nontender, no edema NEURO: Alert and oriented SPECULUM  EXAM: Deferred  LAB RESULTS Results for orders placed or performed during the hospital encounter of 04/01/15 (from the past 168 hour(s))  Culture, OB Urine   Collection Time: 04/01/15 11:15 AM  Result Value Ref Range   Specimen Description OB CLEAN CATCH    Special Requests Normal    Culture      CULTURE REINCUBATED FOR BETTER GROWTH Performed at [facility]    Report Status PENDING   Urinalysis, Routine w reflex microscopic (not at [facility])   Collection Time: 04/01/15 11:15 AM  Result Value Ref Range   Color, Urine YELLOW YELLOW   APPearance CLEAR CLEAR   Specific Gravity, Urine 1.020 1.005 - 1.030   pH 8.0 5.0 - 8.0   Glucose, UA NEGATIVE NEGATIVE mg/dL   Hgb urine dipstick SMALL (A) NEGATIVE   Bilirubin Urine NEGATIVE NEGATIVE   Ketones, ur >80 (A) NEGATIVE mg/dL    Protein, ur 30 (A) NEGATIVE mg/dL   Urobilinogen, UA 2.0 (H) 0.0 - 1.0 mg/dL   Nitrite NEGATIVE NEGATIVE   Leukocytes, UA TRACE (A) NEGATIVE  Urine microscopic-add on   Collection Time: 04/01/15 11:15 AM  Result Value Ref Range   Squamous Epithelial / LPF RARE RARE   WBC, UA 0-2 <3 WBC/hpf   RBC / HPF 7-10 <3 RBC/hpf   Bacteria, UA RARE RARE  CBC   Collection Time: 04/01/15 11:25 AM  Result Value Ref Range   WBC 6.2 4.0 - 10.5 K/uL   RBC 4.14 3.87 - 5.11 MIL/uL   Hemoglobin 11.4 (L) 12.0 - 15.0 g/dL   HCT 33.2 (L) 36.0 - 46.0 %   MCV 80.2 78.0 - 100.0 fL   MCH 27.5 26.0 - 34.0 pg   MCHC 34.3 30.0 - 36.0 g/dL   RDW 13.3 11.5 - 15.5 %   Platelets 323 150 - 400 K/uL  Comprehensive metabolic panel   Collection Time: 04/01/15 11:25 AM  Result Value Ref Range   Sodium 134 (L) 135 - 145 mmol/L   Potassium 3.4 (L) 3.5 - 5.1 mmol/L   Chloride 105 101 - 111 mmol/L   CO2 22 22 - 32 mmol/L   Glucose, Bld 90 65 - 99 mg/dL   BUN <5 (L) 6 - 20 mg/dL   Creatinine, Ser 0.46 0.44 - 1.00 mg/dL   Calcium 9.1 8.9 - 10.3 mg/dL   Total Protein 7.0 6.5 - 8.1 g/dL   Albumin 3.5 3.5 - 5.0 g/dL   AST 16 15 - 41 U/L   ALT 11 (L) 14 - 54 U/L   Alkaline Phosphatase 52 38 - 126 U/L   Total Bilirubin 0.5 0.3 - 1.2 mg/dL   GFR calc non Af Amer >60 >60 mL/min   GFR calc Af Amer >60 >60 mL/min   Anion gap 7 5 - 15  TSH   Collection Time: 04/01/15 11:25 AM  Result Value Ref Range   TSH 0.157 (L) 0.350 - 4.500 uIU/mL  T3   Collection Time: 04/01/15 11:25 AM  Result Value Ref Range   T3, Total 179 71 - 180 ng/dL  T4, free   Collection Time: 04/01/15 11:25 AM  Result Value Ref Range   Free T4 1.02 0.61 - 1.12 ng/dL  Results for orders placed or performed during the hospital encounter of 03/31/15 (from the past 168 hour(s))  Urinalysis, Routine w reflex microscopic (not at [facility])   Collection Time: 03/31/15  1:50 PM  Result Value Ref Range   Color, Urine AMBER (A) YELLOW  APPearance HAZY (A)  CLEAR   Specific Gravity, Urine >1.030 (H) 1.005 - 1.030   pH 6.5 5.0 - 8.0   Glucose, UA NEGATIVE NEGATIVE mg/dL   Hgb urine dipstick LARGE (A) NEGATIVE   Bilirubin Urine SMALL (A) NEGATIVE   Ketones, ur >80 (A) NEGATIVE mg/dL   Protein, ur 30 (A) NEGATIVE mg/dL   Urobilinogen, UA 2.0 (H) 0.0 - 1.0 mg/dL   Nitrite NEGATIVE NEGATIVE   Leukocytes, UA TRACE (A) NEGATIVE  Urine microscopic-add on   Collection Time: 03/31/15  1:50 PM  Result Value Ref Range   Squamous Epithelial / LPF MANY (A) RARE   WBC, UA 0-2 <3 WBC/hpf   RBC / HPF TOO NUMEROUS TO COUNT <3 RBC/hpf   Bacteria, UA MANY (A) RARE   Urine-Other MUCOUS PRESENT    IMAGING No results found.  MAU COURSE UA, CBC, CMET, TSH, LR bolus, phenergan.  Some improvement, but still reports nausea. No vomiting while in MAU. Zofran ordered.  Nausea resolved. Keeping down soda. Does not want to try crackers.  TSH low. Will add T3 and Free T4.  ASSESSMENT 1. Hyperemesis gravidarum before end of [redacted] week gestation with carbohydrate depletion   2. Hyperthyroidism complicating pregnancy in first trimester    PLAN Discharge home in stable condition. Encouraged to take meds on scheduled and reconsider using phenergan suppositories if they can help prevent need for IV meds.     Follow-up Information    Follow up with [facility] On 04/17/2015.   Specialty:  Obstetrics and Gynecology   Why:  For new OB appointment   Contact information:   [address] [place] [phone]      Follow up with [place].   Why:  As needed in emergencies   Contact information:   [address] [id number] [place] [phone]       Medication List    TAKE these medications        famotidine 20 MG tablet  Commonly known as:  PEPCID  Take 1 tablet (20 mg total) by mouth 2 (two) times daily.     glycopyrrolate 1 MG  tablet  Commonly known as:  ROBINUL  Take 1 tablet (1 mg total) by mouth 2 (two) times daily.     metoCLOPramide 10 MG tablet  Commonly known as:  REGLAN  Take 10 mg by mouth every 6 (six) hours as needed for nausea or vomiting.     ondansetron 8 MG disintegrating tablet  Commonly known as:  ZOFRAN ODT  Take 1 tablet (8 mg total) by mouth every 8 (eight) hours as needed for nausea or vomiting.     promethazine 25 MG tablet  Commonly known as:  PHENERGAN  Take 1 tablet (25 mg total) by mouth every 6 (six) hours as needed for nausea.     promethazine 25 MG suppository  Commonly known as:  PHENERGAN  Place 1 suppository (25 mg total) rectally every 6 (six) hours as needed for nausea or vomiting.     pyridoxine 200 MG tablet  Commonly known as:  B-6  Take 100 mg by mouth 2 (two) times daily.     UNISOM PO  Take 1 tablet by mouth 2 (two) times daily as needed (take 1/2 tablet in the morning and 1/2 tablet before bedtime.).       Melissa, [place] 04/02/2015  9:27 PM

## 2015-04-03 LAB — CULTURE, OB URINE: Special Requests: NORMAL

## 2015-04-05 ENCOUNTER — Inpatient Hospital Stay (HOSPITAL_COMMUNITY)
Admission: AD | Admit: 2015-04-05 | Discharge: 2015-04-06 | Disposition: A | Discharge status: Home / Self Care | Payer: Medicaid Other | Source: Ambulatory Visit | Attending: Obstetrics & Gynecology | Admitting: Obstetrics & Gynecology

## 2015-04-05 ENCOUNTER — Encounter (HOSPITAL_COMMUNITY): Payer: Self-pay

## 2015-04-05 DIAGNOSIS — E079 Disorder of thyroid, unspecified: Secondary | ICD-10-CM | POA: Diagnosis not present

## 2015-04-05 DIAGNOSIS — O99281 Endocrine, nutritional and metabolic diseases complicating pregnancy, first trimester: Secondary | ICD-10-CM

## 2015-04-05 DIAGNOSIS — E0590 Thyrotoxicosis, unspecified without thyrotoxic crisis or storm: Secondary | ICD-10-CM

## 2015-04-05 DIAGNOSIS — K117 Disturbances of salivary secretion: Secondary | ICD-10-CM | POA: Insufficient documentation

## 2015-04-05 DIAGNOSIS — O210 Mild hyperemesis gravidarum: Secondary | ICD-10-CM

## 2015-04-05 DIAGNOSIS — Z3A09 9 weeks gestation of pregnancy: Secondary | ICD-10-CM | POA: Insufficient documentation

## 2015-04-05 DIAGNOSIS — Z87891 Personal history of nicotine dependence: Secondary | ICD-10-CM | POA: Diagnosis not present

## 2015-04-05 DIAGNOSIS — O99611 Diseases of the digestive system complicating pregnancy, first trimester: Secondary | ICD-10-CM | POA: Diagnosis not present

## 2015-04-05 LAB — URINALYSIS, ROUTINE W REFLEX MICROSCOPIC
GLUCOSE, UA: NEGATIVE mg/dL
NITRITE: NEGATIVE
PROTEIN: 100 mg/dL — AB
Specific Gravity, Urine: >1.03 — ABNORMAL HIGH (ref 1.005–1.030)
UROBILINOGEN UA: 0.2 mg/dL (ref 0.0–1.0)
pH: 6 (ref 5.0–8.0)

## 2015-04-05 LAB — URINE MICROSCOPIC-ADD ON

## 2015-04-05 MED ORDER — ONDANSETRON HCL 4 MG/2ML IJ SOLN
4.0000 mg | Freq: Once | INTRAMUSCULAR | Status: AC
  Administered 2015-04-05: 4 mg via INTRAVENOUS
  Filled 2015-04-05: qty 2

## 2015-04-05 MED ORDER — DEXTROSE-NACL 5-0.9 % IV SOLN
INTRAVENOUS | Status: DC
  Administered 2015-04-05: via INTRAVENOUS
  Filled 2015-04-05 (×19): qty 1000

## 2015-04-05 MED ORDER — METOCLOPRAMIDE HCL 5 MG/ML IJ SOLN
10.0000 mg | Freq: Once | INTRAMUSCULAR | Status: AC
Start: 2015-04-05 — End: 2015-04-05
  Administered 2015-04-05: 10 mg via INTRAVENOUS
  Filled 2015-04-05: qty 2

## 2015-04-05 NOTE — MAU Provider Note (Signed)
History     CSN: [id number]  Arrival date and time: 04/05/15 2227   None     Chief Complaint  Patient presents with  . Emesis During Pregnancy   This is a 29 y.o. female at [redacted]w[redacted]d by LMP who presents with c/o exacerbation of hyperemesis. States has had nearly constant nausea and vomiting the entire pregnancy.  Has tried multiple medications. Most recently given Reglan by another hospital but ran out of them.  States she thinks it worked better. Has been prescribed Diclegis, Phenergan and Zofran, as well as Robinul for ptyalism.  Has been hospitalized once for this in early June.  Has had 7 prior visits to MAU for this problem.   States prior provider told her it was dangerous to get IV fluids, that it could cause heart problems.   Weight 198 on 6/22 weight today 190 (8 lb weight loss)  History is remarkable for newly diagnosed hyperthyroidism.  States has been so weak, she has not seen her 30 yr old child in 3 weeks (staying with her ex)  Is accompanied by her "roomate" named Mikki Santee..   Emesis  This is a recurrent problem. The current episode started more than 1 month ago. The problem occurs intermittently. The problem has been unchanged. There has been no fever. Pertinent negatives include no abdominal pain, chills, diarrhea, dizziness or fever.    RN Note: Pt has had N/V for weeks. Nothing is helping. Has been hospitalized in the past.        Expand All Collapse All   8lb weight loss in 4 days         OB History    Gravida Para Term Preterm AB TAB SAB Ectopic Multiple Living   4 1 1  2  2   1       Past Medical History  Diagnosis Date  . Cyst, breast   . Depression   . Pregnancy induced hypertension 2007  . Medical history non-contributory     Past Surgical History  Procedure Laterality Date  . Breast biopsy      Family History  Problem Relation Age of Onset  . Asthma Sister   . Asthma Son   . Cancer Paternal Grandmother     breast  . Heart disease  Paternal Grandmother   . Diabetes Neg Hx   . Hypertension Neg Hx     History  Substance Use Topics  . Smoking status: Former Smoker    Quit date: 02/10/2015  . Smokeless tobacco: Never Used  . Alcohol Use: No     Comment: not while pregnant    Allergies: No Known Allergies  Prescriptions prior to admission  Medication Sig Dispense Refill Last Dose  . Doxylamine Succinate, Sleep, (UNISOM PO) Take 1 tablet by mouth 2 (two) times daily as needed (take 1/2 tablet in the morning and 1/2 tablet before bedtime.).   Past Month at Unknown time  . famotidine (PEPCID) 20 MG tablet Take 1 tablet (20 mg total) by mouth 2 (two) times daily. 30 tablet 3   . glycopyrrolate (ROBINUL) 1 MG tablet Take 1 tablet (1 mg total) by mouth 2 (two) times daily. 60 tablet 0 Past Week at Unknown time  . metoCLOPramide (REGLAN) 10 MG tablet Take 10 mg by mouth every 6 (six) hours as needed for nausea or vomiting.   Past Month at Unknown time  . ondansetron (ZOFRAN ODT) 8 MG disintegrating tablet Take 1 tablet (8 mg total) by mouth every 8 (eight)  hours as needed for nausea or vomiting. 20 tablet 2   . promethazine (PHENERGAN) 25 MG suppository Place 1 suppository (25 mg total) rectally every 6 (six) hours as needed for nausea or vomiting. 12 each 4   . promethazine (PHENERGAN) 25 MG tablet Take 1 tablet (25 mg total) by mouth every 6 (six) hours as needed for nausea. 30 tablet 0   . pyridoxine (B-6) 200 MG tablet Take 100 mg by mouth 2 (two) times daily.   Past Month at Unknown time    Review of Systems  Constitutional: Positive for malaise/fatigue. Negative for fever and chills.  Gastrointestinal: Positive for nausea, vomiting and constipation. Negative for abdominal pain and diarrhea.  Genitourinary: Negative for dysuria.       Negative for vaginal bleeding  Neurological: Positive for weakness. Negative for dizziness and focal weakness.   Physical Exam   Blood pressure 119/79, pulse 108, temperature 98.8 F  (37.1 C), temperature source Oral, resp. rate 18, height 5\' 8"  (1.727 m), weight 190 lb 3.2 oz (86.274 kg), last menstrual period 01/30/2015.  Physical Exam  Constitutional: She is oriented to person, place, and time. She appears well-developed. No distress.  HENT:  Head: Normocephalic.  Cardiovascular: Regular rhythm.   HR in the 90s   Respiratory: Effort normal and breath sounds normal.  GI: Soft. Bowel sounds are normal. She exhibits no distension. There is no tenderness. There is no rebound and no guarding.  Musculoskeletal: Normal range of motion.  Neurological: She is alert and oriented to person, place, and time.  Skin: Skin is warm and dry. She is not diaphoretic.  Psychiatric: She has a normal mood and affect.    MAU Course  Procedures  MDM This patient has had multiple visits for hyperemesis. She feels the Reglan helped more but ran out of Rx.  Has lost weight, but last visit, had gained 2 lbs. Discussed with Dr Roselie Awkward.  Will try hydration and she may need Medrol taper at some point if Reglan does not work.  May not need admission if we can get her to tolerate POs tonight.   IV D5NS given (1 liter) with zofran and reglan given IV IV D5Lr with MVI x1 liter Able to tolerate ginger ale after 1.5 liters of fluid.  Assessment and Plan  A:  SIUP at [redacted]w[redacted]d       Hyperemesis with dehydration and weight loss      Hyperthyroidism      Ptyalism  P:  Discharge home       New Rx given for Reglan to take PRN nausea       Continue Zofran or Phenergan for nausea/vomiting, as well as Robinul as needed for ptyalism        If she does not have success with new Rx of Reglan, will need to try a Steroid Taper (per Up To Date, Prednisone 40mg  x 1, then 20mg  x 3 days, then 10mg  x 3 days then 5mg  for 7 days)       Follow up in clinic for prenatal care and followup (has appt)  Hansel Feinstein 04/05/2015, 10:43 PM

## 2015-04-05 NOTE — MAU Note (Signed)
Pt has had N/V for weeks. Nothing is helping. Has been hospitalized in the past.

## 2015-04-05 NOTE — MAU Note (Signed)
8lb weight loss in 4 days

## 2015-04-06 DIAGNOSIS — O210 Mild hyperemesis gravidarum: Secondary | ICD-10-CM

## 2015-04-06 MED ORDER — M.V.I. ADULT IV INJ
INJECTION | Freq: Once | INTRAVENOUS | Status: AC
  Administered 2015-04-06: 02:00:00 via INTRAVENOUS
  Filled 2015-04-06: qty 10

## 2015-04-06 MED ORDER — METOCLOPRAMIDE HCL 10 MG PO TABS: 10.0000 mg | ORAL_TABLET | Freq: Four times a day (QID) | ORAL | Status: DC

## 2015-04-06 NOTE — Discharge Instructions (Signed)
Hyperemesis Gravidarum °Hyperemesis gravidarum is a severe form of nausea and vomiting that happens during pregnancy. Hyperemesis is worse than morning sickness. It may cause you to have nausea or vomiting all day for many days. It may keep you from eating and drinking enough food and liquids. Hyperemesis usually occurs during the first half (the first 20 weeks) of pregnancy. It often goes away once a woman is in her second half of pregnancy. However, sometimes hyperemesis continues through an entire pregnancy.  °CAUSES  °The cause of this condition is not completely known but is thought to be related to changes in the body's hormones when pregnant. It could be from the high level of the pregnancy hormone or an increase in estrogen in the body.  °SIGNS AND SYMPTOMS  °· Severe nausea and vomiting. °· Nausea that does not go away. °· Vomiting that does not allow you to keep any food down. °· Weight loss and body fluid loss (dehydration). °· Having no desire to eat or not liking food you have previously enjoyed. °DIAGNOSIS  °Your health care provider will do a physical exam and ask you about your symptoms. He or she may also order blood tests and urine tests to make sure something else is not causing the problem.  °TREATMENT  °You may only need medicine to control the problem. If medicines do not control the nausea and vomiting, you will be treated in the hospital to prevent dehydration, increased acid in the blood (acidosis), weight loss, and changes in the electrolytes in your body that may harm the unborn baby (fetus). You may need IV fluids.  °HOME CARE INSTRUCTIONS  °· Only take over-the-counter or prescription medicines as directed by your health care provider. °· Try eating a couple of dry crackers or toast in the morning before getting out of bed. °· Avoid foods and smells that upset your stomach. °· Avoid fatty and spicy foods. °· Eat 5-6 small meals a day. °· Do not drink when eating meals. Drink between  meals. °· For snacks, eat high-protein foods, such as cheese. °· Eat or suck on things that have ginger in them. Ginger helps nausea. °· Avoid food preparation. The smell of food can spoil your appetite. °· Avoid iron pills and iron in your multivitamins until after 3-4 months of being pregnant. However, consult with your health care provider before stopping any prescribed iron pills. °SEEK MEDICAL CARE IF:  °· Your abdominal pain increases. °· You have a severe headache. °· You have vision problems. °· You are losing weight. °SEEK IMMEDIATE MEDICAL CARE IF:  °· You are unable to keep fluids down. °· You vomit blood. °· You have constant nausea and vomiting. °· You have excessive weakness. °· You have extreme thirst. °· You have dizziness or fainting. °· You have a fever or persistent symptoms for more than 2-3 days. °· You have a fever and your symptoms suddenly get worse. °MAKE SURE YOU:  °· Understand these instructions. °· Will watch your condition. °· Will get help right away if you are not doing well or get worse. °Document Released: 09/27/2005 Document Revised: 07/18/2013 Document Reviewed: 05/09/2013 °[employer]® Patient Information ©2015 [employer]. This information is not intended to replace advice given to you by your health care provider. Make sure you discuss any questions you have with your health care provider. ° °

## 2015-04-10 ENCOUNTER — Ambulatory Visit (HOSPITAL_COMMUNITY): Payer: 59

## 2015-04-15 ENCOUNTER — Inpatient Hospital Stay (HOSPITAL_COMMUNITY)
Admission: AD | Admit: 2015-04-15 | Discharge: 2015-04-15 | Disposition: A | Discharge status: Home / Self Care | Payer: 59 | Source: Ambulatory Visit | Attending: Family Medicine | Admitting: Family Medicine

## 2015-04-15 ENCOUNTER — Inpatient Hospital Stay (HOSPITAL_COMMUNITY)
Admission: AD | Admit: 2015-04-15 | Discharge: 2015-04-15 | Discharge status: Against Medical Advice | Payer: 59 | Source: Ambulatory Visit | Attending: Family Medicine | Admitting: Family Medicine

## 2015-04-15 ENCOUNTER — Ambulatory Visit (HOSPITAL_COMMUNITY)
Admission: RE | Admit: 2015-04-15 | Discharge: 2015-04-15 | Disposition: A | Discharge status: Home / Self Care | Payer: 59 | Source: Ambulatory Visit | Attending: Family Medicine | Admitting: Family Medicine

## 2015-04-15 DIAGNOSIS — Z5321 Procedure and treatment not carried out due to patient leaving prior to being seen by health care provider: Secondary | ICD-10-CM | POA: Insufficient documentation

## 2015-04-15 DIAGNOSIS — D259 Leiomyoma of uterus, unspecified: Secondary | ICD-10-CM | POA: Diagnosis not present

## 2015-04-15 DIAGNOSIS — O3411 Maternal care for benign tumor of corpus uteri, first trimester: Secondary | ICD-10-CM | POA: Diagnosis not present

## 2015-04-15 DIAGNOSIS — O211 Hyperemesis gravidarum with metabolic disturbance: Secondary | ICD-10-CM

## 2015-04-15 DIAGNOSIS — O9989 Other specified diseases and conditions complicating pregnancy, childbirth and the puerperium: Secondary | ICD-10-CM | POA: Diagnosis present

## 2015-04-15 DIAGNOSIS — O208 Other hemorrhage in early pregnancy: Secondary | ICD-10-CM | POA: Diagnosis not present

## 2015-04-15 DIAGNOSIS — O210 Mild hyperemesis gravidarum: Secondary | ICD-10-CM | POA: Diagnosis present

## 2015-04-15 DIAGNOSIS — Z3A11 11 weeks gestation of pregnancy: Secondary | ICD-10-CM | POA: Insufficient documentation

## 2015-04-15 LAB — URINALYSIS, ROUTINE W REFLEX MICROSCOPIC
Glucose, UA: NEGATIVE mg/dL
Ketones, ur: >80 mg/dL — AB
Nitrite: NEGATIVE
PROTEIN: 100 mg/dL — AB
Specific Gravity, Urine: 1.025 (ref 1.005–1.030)
UROBILINOGEN UA: 2 mg/dL — AB (ref 0.0–1.0)
pH: 6 (ref 5.0–8.0)

## 2015-04-15 LAB — URINE MICROSCOPIC-ADD ON

## 2015-04-15 NOTE — MAU Provider Note (Signed)
Patient here for follow up US.   No complaints at this time.   Patient has an appointment in the [facility] this Thursday and is encouraged to keep that appointment.  US Ob Comp Less 14 Wks  04/15/2015   CLINICAL DATA:  Hyperemesis  EXAM: OBSTETRIC <14 WK ULTRASOUND  TECHNIQUE: Transabdominal ultrasound was performed for evaluation of the gestation as well as the maternal uterus and adnexal regions.  COMPARISON:  None.  FINDINGS: Intrauterine gestational sac: Single  Yolk sac:  Yes  Embryo:  Yes  Cardiac Activity: Yes  Heart Rate: 158 bpm  CRL:   4.72 cm  mm   11 w 4d                  [place] EDC: 10/31/2015  Maternal uterus/adnexae:  Subchorionic hemorrhage: Small hemorrhage noted measuring 4.9 x 0.6 x 4.1 cm  Right ovary: Normal  Left ovary: Normal  Other :Several fibroids noted. The largest measures 4.2 x 4.5 x 4.9 cm.  Free fluid:  Trace free fluid noted.  IMPRESSION: 1. Single living intrauterine gestation. The estimated gestational age is 28 weeks and 4 days. 2. Small subchorionic hemorrhage. 3. Uterine fibroids.   Electronically Signed   By: Kerby Moors M.D.   On: 04/15/2015 14:19    Lezlie Lye, NP 04/15/2015 3:21 PM

## 2015-04-15 NOTE — MAU Note (Addendum)
Patient approached registration staff, stated she was leaving b/c she didn't want to wait.  Not triaged yet, but did come onto unit to collect urine sample. Urinalysis ordered and collected prior to patient leaving AMA.

## 2015-04-16 ENCOUNTER — Encounter (HOSPITAL_COMMUNITY): Payer: Self-pay | Admitting: *Deleted

## 2015-04-16 ENCOUNTER — Inpatient Hospital Stay (HOSPITAL_COMMUNITY)
Admission: AD | Admit: 2015-04-16 | Discharge: 2015-04-16 | Disposition: A | Discharge status: Home / Self Care | Payer: 59 | Source: Ambulatory Visit | Attending: Obstetrics & Gynecology | Admitting: Obstetrics & Gynecology

## 2015-04-16 DIAGNOSIS — O210 Mild hyperemesis gravidarum: Secondary | ICD-10-CM | POA: Diagnosis present

## 2015-04-16 DIAGNOSIS — Z3A10 10 weeks gestation of pregnancy: Secondary | ICD-10-CM | POA: Diagnosis not present

## 2015-04-16 DIAGNOSIS — Z87891 Personal history of nicotine dependence: Secondary | ICD-10-CM | POA: Diagnosis not present

## 2015-04-16 DIAGNOSIS — O219 Vomiting of pregnancy, unspecified: Secondary | ICD-10-CM | POA: Diagnosis not present

## 2015-04-16 HISTORY — DX: Unspecified infectious disease: B99.9

## 2015-04-16 LAB — URINALYSIS, ROUTINE W REFLEX MICROSCOPIC
Glucose, UA: NEGATIVE mg/dL
Ketones, ur: >80 mg/dL — AB
Leukocytes, UA: NEGATIVE
Nitrite: NEGATIVE
PROTEIN: 100 mg/dL — AB
Specific Gravity, Urine: 1.025 (ref 1.005–1.030)
UROBILINOGEN UA: 1 mg/dL (ref 0.0–1.0)
pH: 6 (ref 5.0–8.0)

## 2015-04-16 LAB — URINE MICROSCOPIC-ADD ON

## 2015-04-16 MED ORDER — DEXTROSE 5 % IN LACTATED RINGERS IV BOLUS
1000.0000 mL | Freq: Once | INTRAVENOUS | Status: AC
  Administered 2015-04-16: 1000 mL via INTRAVENOUS

## 2015-04-16 MED ORDER — METOCLOPRAMIDE HCL 10 MG PO TABS: 10.0000 mg | ORAL_TABLET | Freq: Four times a day (QID) | ORAL | Status: DC

## 2015-04-16 MED ORDER — FAMOTIDINE 20 MG PO TABS: 20.0000 mg | ORAL_TABLET | Freq: Two times a day (BID) | ORAL | Status: DC

## 2015-04-16 MED ORDER — M.V.I. ADULT IV INJ
INJECTION | Freq: Once | INTRAVENOUS | Status: AC
  Administered 2015-04-16: 13:00:00 via INTRAVENOUS
  Filled 2015-04-16: qty 1000

## 2015-04-16 MED ORDER — PROMETHAZINE HCL 25 MG PO TABS: 25.0000 mg | ORAL_TABLET | Freq: Four times a day (QID) | ORAL | Status: DC | PRN

## 2015-04-16 MED ORDER — GLYCOPYRROLATE 1 MG PO TABS: 1.0000 mg | ORAL_TABLET | Freq: Two times a day (BID) | ORAL | Status: DC

## 2015-04-16 MED ORDER — ONDANSETRON 8 MG PO TBDP: 8.0000 mg | ORAL_TABLET | Freq: Three times a day (TID) | ORAL | Status: DC | PRN

## 2015-04-16 MED ORDER — PROMETHAZINE HCL 25 MG/ML IJ SOLN
25.0000 mg | Freq: Once | INTRAMUSCULAR | Status: AC
  Administered 2015-04-16: 25 mg via INTRAVENOUS
  Filled 2015-04-16: qty 1

## 2015-04-16 MED ORDER — FAMOTIDINE IN NACL 20-0.9 MG/50ML-% IV SOLN
20.0000 mg | Freq: Once | INTRAVENOUS | Status: AC
  Administered 2015-04-16: 20 mg via INTRAVENOUS
  Filled 2015-04-16: qty 50

## 2015-04-16 NOTE — MAU Note (Signed)
Ongoing problem with vomiting.  Is on meds. Was here last night, did not stay "too many people here".

## 2015-04-16 NOTE — MAU Provider Note (Signed)
History    Chief Complaint  Patient presents with  . Emesis During Pregnancy   HPI   Joyce Rivers is a 29 y.o. [id number] [redacted]w[redacted]d who presents to the MAU with hyperemesis. She was seen and diagnosed with hyperemesis about 6 weeks ago. She states that the vomiting occurs every day for most of the day and is unable to keep most food and fluids down. It has become so severe that she has had to quit her job. She endorses some hematemesis, an approximate 25 pound weight loss, and infrequent urination. She denies fever, chills, night sweats, and hematochezia.   She has been prescribed multiple anti-emesis medications and they only work occasionally. She has ran out of all of the anti-emesis medications and the vomiting became worse. She had mild emesis in her previous pregnancy but it was not nearly as severe and the vomiting stopped around 20 weeks.    OB History    Gravida Para Term Preterm AB TAB SAB Ectopic Multiple Living   4 1 1  2  2   1       Past Medical History  Diagnosis Date  . Cyst, breast   . Pregnancy induced hypertension 2007  . Medical history non-contributory   . Infection     UTI  . Depression     pt denies 7/6    Past Surgical History  Procedure Laterality Date  . Breast biopsy      Family History  Problem Relation Age of Onset  . Asthma Sister   . Asthma Son   . Cancer Paternal Grandmother     breast  . Heart disease Paternal Grandmother   . Diabetes Neg Hx   . Hypertension Neg Hx     History  Substance Use Topics  . Smoking status: Former Smoker    Quit date: 02/10/2015  . Smokeless tobacco: Never Used  . Alcohol Use: No     Comment: not while pregnant    Allergies: No Known Allergies  No prescriptions prior to admission   Results for orders placed or performed during the hospital encounter of 04/16/15 (from the past 24 hour(s))  Urinalysis, Routine w reflex microscopic (not at [facility])     Status: Abnormal   Collection Time: 04/16/15 10:00  AM  Result Value Ref Range   Color, Urine YELLOW YELLOW   APPearance CLEAR CLEAR   Specific Gravity, Urine 1.025 1.005 - 1.030   pH 6.0 5.0 - 8.0   Glucose, UA NEGATIVE NEGATIVE mg/dL   Hgb urine dipstick SMALL (A) NEGATIVE   Bilirubin Urine SMALL (A) NEGATIVE   Ketones, ur >80 (A) NEGATIVE mg/dL   Protein, ur 100 (A) NEGATIVE mg/dL   Urobilinogen, UA 1.0 0.0 - 1.0 mg/dL   Nitrite NEGATIVE NEGATIVE   Leukocytes, UA NEGATIVE NEGATIVE  Urine microscopic-add on     Status: None   Collection Time: 04/16/15 10:00 AM  Result Value Ref Range   Squamous Epithelial / LPF RARE RARE   WBC, UA 0-2 <3 WBC/hpf   RBC / HPF 0-2 <3 RBC/hpf    Review of Systems  Constitutional: Positive for weight loss (approximately 25 pounds) and malaise/fatigue. Negative for fever and chills.  Gastrointestinal: Positive for nausea, vomiting and constipation. Negative for blood in stool.       Endorses haematemesis  Genitourinary:       Decreased urinary frequency  Neurological: Positive for headaches (occasional right sided ).   Physical Exam Blood pressure 125/79, temperature 98.1 F (36.7  C), temperature source Oral, resp. rate 16, weight 190 lb (86.183 kg), last menstrual period 01/30/2015. Physical Exam  Constitutional: She is oriented to person, place, and time. She appears well-developed and well-nourished. No distress.  In mild discomfort  HENT:  Head: Normocephalic.  Neck: Normal range of motion.  Cardiovascular: Normal rate and regular rhythm.   Mild physiological s2 splitting upon inspiration  Respiratory: Breath sounds normal. She has no wheezes. She has no rales.  GI: Soft. There is no tenderness.  Hypoactive bowel sounds  Musculoskeletal: Normal range of motion.  Neurological: She is alert and oriented to person, place, and time.  Skin: Skin is warm. She is not diaphoretic.  Psychiatric: Her behavior is normal.    MAU Course Procedures  None  MDM  + fetal heart tones by  doppler  Hyperemesis with nausea: given D5LR bolus with Pepcid 20mg  IVPB, Lactated ringers 1,063mL with multivitamins adult 67mL infusion and Phenergan injection 25mg .  Patient tolerated PO fluids and is feeling much better following fluids   Assessment:  1. Nausea and vomiting in pregnancy    Plan: Discharge home in stable condition RX: Reglan, phenergan, zofran, pepcid- refills given  Small, frequent meals Increase PO fluid intake.   Lezlie Lye, NP 04/16/2015 1:59 PM

## 2015-04-16 NOTE — Discharge Instructions (Signed)

## 2015-04-17 ENCOUNTER — Encounter: Payer: 59 | Admitting: Obstetrics and Gynecology

## 2015-04-23 ENCOUNTER — Inpatient Hospital Stay (HOSPITAL_COMMUNITY)
Admission: AD | Admit: 2015-04-23 | Discharge: 2015-04-23 | Disposition: A | Discharge status: Home / Self Care | Payer: Medicaid Other | Source: Ambulatory Visit | Attending: Obstetrics and Gynecology | Admitting: Obstetrics and Gynecology

## 2015-04-23 DIAGNOSIS — Z3A12 12 weeks gestation of pregnancy: Secondary | ICD-10-CM | POA: Diagnosis not present

## 2015-04-23 DIAGNOSIS — O99281 Endocrine, nutritional and metabolic diseases complicating pregnancy, first trimester: Secondary | ICD-10-CM | POA: Insufficient documentation

## 2015-04-23 DIAGNOSIS — R102 Pelvic and perineal pain: Secondary | ICD-10-CM | POA: Diagnosis not present

## 2015-04-23 DIAGNOSIS — N949 Unspecified condition associated with female genital organs and menstrual cycle: Secondary | ICD-10-CM | POA: Diagnosis not present

## 2015-04-23 DIAGNOSIS — Z87891 Personal history of nicotine dependence: Secondary | ICD-10-CM | POA: Diagnosis not present

## 2015-04-23 DIAGNOSIS — E079 Disorder of thyroid, unspecified: Secondary | ICD-10-CM | POA: Insufficient documentation

## 2015-04-23 DIAGNOSIS — R1032 Left lower quadrant pain: Secondary | ICD-10-CM | POA: Diagnosis present

## 2015-04-23 LAB — URINALYSIS, ROUTINE W REFLEX MICROSCOPIC
BILIRUBIN URINE: NEGATIVE
Glucose, UA: NEGATIVE mg/dL
HGB URINE DIPSTICK: NEGATIVE
KETONES UR: NEGATIVE mg/dL
LEUKOCYTES UA: NEGATIVE
Nitrite: NEGATIVE
PROTEIN: NEGATIVE mg/dL
UROBILINOGEN UA: 2 mg/dL — AB (ref 0.0–1.0)
pH: 6 (ref 5.0–8.0)

## 2015-04-23 NOTE — MAU Provider Note (Signed)
History     CSN: [id number]  Arrival date and time: 04/23/15 1914   None     No chief complaint on file.  Abdominal Pain This is a new problem. The current episode started today. The onset quality is gradual (around 1100 this morning ). The problem occurs intermittently. The problem has been gradually worsening. The pain is located in the LLQ. The pain is at a severity of 8/10. The quality of the pain is cramping. The abdominal pain radiates to the back. Associated symptoms include dysuria and nausea. Pertinent negatives include no constipation, diarrhea, fever, frequency or vomiting. The pain is aggravated by certain positions (sitting ). The pain is relieved by movement. She has tried nothing for the symptoms.   Past Medical History  Diagnosis Date  . Cyst, breast   . Pregnancy induced hypertension 2007  . Medical history non-contributory   . Infection     UTI  . Depression     pt denies 7/6    Past Surgical History  Procedure Laterality Date  . Breast biopsy      Family History  Problem Relation Age of Onset  . Asthma Sister   . Asthma Son   . Cancer Paternal Grandmother     breast  . Heart disease Paternal Grandmother   . Diabetes Neg Hx   . Hypertension Neg Hx     History  Substance Use Topics  . Smoking status: Former Smoker    Quit date: 02/10/2015  . Smokeless tobacco: Never Used  . Alcohol Use: No     Comment: not while pregnant    Allergies: No Known Allergies  Prescriptions prior to admission  Medication Sig Dispense Refill Last Dose  . Doxylamine Succinate, Sleep, (UNISOM PO) Take 1 tablet by mouth 2 (two) times daily as needed (take 1/2 tablet in the morning and 1/2 tablet before bedtime.).   04/15/2015 at night  . famotidine (PEPCID) 20 MG tablet Take 1 tablet (20 mg total) by mouth 2 (two) times daily. 30 tablet 3   . glycopyrrolate (ROBINUL) 1 MG tablet Take 1 tablet (1 mg total) by mouth 2 (two) times daily. 60 tablet 1   . metoCLOPramide  (REGLAN) 10 MG tablet Take 1 tablet (10 mg total) by mouth every 6 (six) hours. 90 tablet 1   . ondansetron (ZOFRAN ODT) 8 MG disintegrating tablet Take 1 tablet (8 mg total) by mouth every 8 (eight) hours as needed for nausea or vomiting. 20 tablet 2   . promethazine (PHENERGAN) 25 MG tablet Take 1 tablet (25 mg total) by mouth every 6 (six) hours as needed for nausea. 30 tablet 1   . pyridoxine (B-6) 200 MG tablet Take 100 mg by mouth 2 (two) times daily.   04/15/2015 at Unknown time    Review of Systems  Constitutional: Negative for fever.  Gastrointestinal: Positive for nausea and abdominal pain. Negative for vomiting, diarrhea and constipation.  Genitourinary: Positive for dysuria. Negative for urgency and frequency.   Physical Exam   Blood pressure 115/58, pulse 99, temperature 98.5 F (36.9 C), temperature source Oral, resp. rate 18, height 5\' 8"  (1.727 m), weight 90.266 kg (199 lb), last menstrual period 01/30/2015, SpO2 100 %.  Physical Exam  Vitals reviewed. Constitutional: She is oriented to person, place, and time. She appears well-developed and well-nourished. No distress.  HENT:  Head: Normocephalic.  Cardiovascular: Normal rate.   Respiratory: Effort normal.  GI: Soft. There is no tenderness. There is no rebound.  Genitourinary:  Uterus: AGA, FHT 158 with doppler   Neurological: She is alert and oriented to person, place, and time.  Skin: Skin is warm and dry.  Psychiatric: She has a normal mood and affect.   Results for orders placed or performed during the hospital encounter of 04/23/15 (from the past 24 hour(s))  Urinalysis, Routine w reflex microscopic (not at [facility])     Status: Abnormal   Collection Time: 04/23/15  8:14 PM  Result Value Ref Range   Color, Urine STRAW (A) YELLOW   APPearance CLEAR CLEAR   Specific Gravity, Urine >1.030 (H) 1.005 - 1.030   pH 6.0 5.0 - 8.0   Glucose, UA NEGATIVE NEGATIVE mg/dL   Hgb urine dipstick NEGATIVE NEGATIVE   Bilirubin  Urine NEGATIVE NEGATIVE   Ketones, ur NEGATIVE NEGATIVE mg/dL   Protein, ur NEGATIVE NEGATIVE mg/dL   Urobilinogen, UA 2.0 (H) 0.0 - 1.0 mg/dL   Nitrite NEGATIVE NEGATIVE   Leukocytes, UA NEGATIVE NEGATIVE    MAU Course  Procedures  MDM   Assessment and Plan   1. Thyroid dysfunction in pregnancy in first trimester   2. Round ligament pain    DC home Comfort measures reviewed  2nd Trimester precautions  RX: none  Return to MAU as needed FU with OB as planned  Follow-up Information    Follow up with [facility].   Specialty:  Obstetrics and Gynecology   Why:  As scheduled   Contact information:   [address] [place] [phone]       Mathis Bud 04/23/2015, 8:26 PM

## 2015-04-23 NOTE — MAU Note (Signed)
Pt reports she has been having what feels like contractions all day today. Denies bleeding.

## 2015-04-23 NOTE — Discharge Instructions (Signed)

## 2015-04-25 ENCOUNTER — Inpatient Hospital Stay (HOSPITAL_COMMUNITY)
Admission: AD | Admit: 2015-04-25 | Discharge: 2015-04-25 | Disposition: A | Discharge status: Home / Self Care | Payer: Medicaid - Out of State | Source: Ambulatory Visit | Attending: Family Medicine | Admitting: Family Medicine

## 2015-04-25 ENCOUNTER — Encounter (HOSPITAL_COMMUNITY): Payer: Self-pay | Admitting: *Deleted

## 2015-04-25 DIAGNOSIS — O26899 Other specified pregnancy related conditions, unspecified trimester: Secondary | ICD-10-CM

## 2015-04-25 DIAGNOSIS — O3410 Maternal care for benign tumor of corpus uteri, unspecified trimester: Secondary | ICD-10-CM

## 2015-04-25 DIAGNOSIS — A499 Bacterial infection, unspecified: Secondary | ICD-10-CM | POA: Diagnosis not present

## 2015-04-25 DIAGNOSIS — O3411 Maternal care for benign tumor of corpus uteri, first trimester: Secondary | ICD-10-CM | POA: Insufficient documentation

## 2015-04-25 DIAGNOSIS — R109 Unspecified abdominal pain: Secondary | ICD-10-CM | POA: Insufficient documentation

## 2015-04-25 DIAGNOSIS — B9689 Other specified bacterial agents as the cause of diseases classified elsewhere: Secondary | ICD-10-CM

## 2015-04-25 DIAGNOSIS — Z3A12 12 weeks gestation of pregnancy: Secondary | ICD-10-CM | POA: Diagnosis not present

## 2015-04-25 DIAGNOSIS — Z87891 Personal history of nicotine dependence: Secondary | ICD-10-CM | POA: Diagnosis not present

## 2015-04-25 DIAGNOSIS — N760 Acute vaginitis: Secondary | ICD-10-CM

## 2015-04-25 DIAGNOSIS — O23591 Infection of other part of genital tract in pregnancy, first trimester: Secondary | ICD-10-CM | POA: Insufficient documentation

## 2015-04-25 DIAGNOSIS — D259 Leiomyoma of uterus, unspecified: Secondary | ICD-10-CM | POA: Insufficient documentation

## 2015-04-25 LAB — URINALYSIS, ROUTINE W REFLEX MICROSCOPIC
Bilirubin Urine: NEGATIVE
Glucose, UA: NEGATIVE mg/dL
HGB URINE DIPSTICK: NEGATIVE
Ketones, ur: NEGATIVE mg/dL
NITRITE: NEGATIVE
Protein, ur: NEGATIVE mg/dL
SPECIFIC GRAVITY, URINE: 1.025 (ref 1.005–1.030)
UROBILINOGEN UA: 0.2 mg/dL (ref 0.0–1.0)
pH: 6.5 (ref 5.0–8.0)

## 2015-04-25 LAB — URINE MICROSCOPIC-ADD ON

## 2015-04-25 LAB — WET PREP, GENITAL
Trich, Wet Prep: NONE SEEN
Yeast Wet Prep HPF POC: NONE SEEN

## 2015-04-25 MED ORDER — CYCLOBENZAPRINE HCL 10 MG PO TABS
10.0000 mg | ORAL_TABLET | Freq: Once | ORAL | Status: AC
  Administered 2015-04-25: 10 mg via ORAL
  Filled 2015-04-25: qty 1

## 2015-04-25 MED ORDER — CYCLOBENZAPRINE HCL 10 MG PO TABS: 10.0000 mg | ORAL_TABLET | Freq: Three times a day (TID) | ORAL | Status: DC | PRN

## 2015-04-25 MED ORDER — METRONIDAZOLE 500 MG PO TABS: 500.0000 mg | ORAL_TABLET | Freq: Two times a day (BID) | ORAL | Status: DC

## 2015-04-25 NOTE — MAU Note (Signed)
abd  Pain started 3 days ago, has gotten worse,  Is unable to sleep .  Feels like contractions.  Spotting. Yesterday.

## 2015-04-25 NOTE — MAU Provider Note (Signed)
History     CSN: [id number]  Arrival date and time: 04/25/15 4098   First Provider Initiated Contact with Patient 04/25/15 1009      Chief Complaint  Patient presents with  . Abdominal Pain   HPI Joyce Rivers 29 y.o. [id number] @[redacted]w[redacted]d  presents to MAU with complaint of abdominal pain.   This began in the last day or so.  She states it feels like a cramping, worse on the left side.  It is present all the time.  No worse with eating or using bathroom.  1000mg  of Tylenol earlier this morning was not helpful at all.  She denies vaginal bleeding but did have some spotting yesterday.  She reports her nausea and vomiting is improved overall since last seen in MAU.  She has not yet felt fetal movement this pregnancy.  She denies fever, shortness of breath, HA.   OB History    Gravida Para Term Preterm AB TAB SAB Ectopic Multiple Living   4 1 1  2  2   1       Past Medical History  Diagnosis Date  . Cyst, breast   . Pregnancy induced hypertension 2007  . Medical history non-contributory   . Infection     UTI  . Depression     pt denies 7/6    Past Surgical History  Procedure Laterality Date  . Breast biopsy    . Wisdom tooth extraction      Family History  Problem Relation Age of Onset  . Asthma Sister   . Asthma Son   . Cancer Paternal Grandmother     breast  . Heart disease Paternal Grandmother   . Diabetes Neg Hx   . Hypertension Neg Hx     History  Substance Use Topics  . Smoking status: Former Smoker    Quit date: 02/10/2015  . Smokeless tobacco: Never Used  . Alcohol Use: No     Comment: not while pregnant    Allergies: No Known Allergies  Prescriptions prior to admission  Medication Sig Dispense Refill Last Dose  . famotidine (PEPCID) 20 MG tablet Take 1 tablet (20 mg total) by mouth 2 (two) times daily. 30 tablet 3 04/25/2015 at Unknown time  . metoCLOPramide (REGLAN) 10 MG tablet Take 1 tablet (10 mg total) by mouth every 6 (six) hours. 90 tablet 1  04/25/2015 at Unknown time  . promethazine (PHENERGAN) 25 MG tablet Take 1 tablet (25 mg total) by mouth every 6 (six) hours as needed for nausea. (Patient taking differently: Take 25 mg by mouth at bedtime as needed for nausea. ) 30 tablet 1 04/24/2015 at Unknown time  . glycopyrrolate (ROBINUL) 1 MG tablet Take 1 tablet (1 mg total) by mouth 2 (two) times daily. (Patient not taking: Reported on 04/25/2015) 60 tablet 1 Not Taking at Unknown time  . ondansetron (ZOFRAN ODT) 8 MG disintegrating tablet Take 1 tablet (8 mg total) by mouth every 8 (eight) hours as needed for nausea or vomiting. (Patient not taking: Reported on 04/25/2015) 20 tablet 2 Not Taking at Unknown time    ROS Pertinent ROS in HPI.  All other systems are negative.   Physical Exam   Blood pressure 115/71, pulse 100, temperature 98.1 F (36.7 C), temperature source Oral, resp. rate 18, last menstrual period 01/30/2015.  Physical Exam  Constitutional: She is oriented to person, place, and time. She appears well-developed and well-nourished. No distress.  HENT:  Head: Normocephalic and atraumatic.  Eyes: EOM are  normal.  Neck: Normal range of motion.  Cardiovascular: Normal rate.   Respiratory: Effort normal. No respiratory distress.  GI: Soft. She exhibits no distension. There is tenderness.  Firmness with tenderness in LLQ  Genitourinary:  White vaginal discharge.   No CMT. Cervix is closed  Musculoskeletal: Normal range of motion.  Neurological: She is alert and oriented to person, place, and time.  Skin: Skin is warm and dry.  Psychiatric: She has a normal mood and affect.    MAU Course  Procedures  MDM Flexeril ordered to address pt's symptoms Due to multiple MAU visits and no STD testing - this is done today with abdominal pain complaint.  BV noted on wet prep.   U/A negative Pt notes improvement with Flexeril and requests Rx    Assessment and Plan  A:  1. Bacterial vaginosis   2. Uterine fibroid in  pregnancy   3. Abdominal pain in pregnancy     P: Discharge to home Flagyl x 1 week for BV No etoh/IC x 10 days Tylenol OTC prn Flexeril rx given for prn cramping Has NO[facility] appt 7/18 at [facility].  Keep appt  Paticia Stack 04/25/2015, 11:42 AM

## 2015-04-25 NOTE — Discharge Instructions (Signed)
Bacterial Vaginosis Bacterial vaginosis is a vaginal infection that occurs when the normal balance of bacteria in the vagina is disrupted. It results from an overgrowth of certain bacteria. This is the most common vaginal infection in women of childbearing age. Treatment is important to prevent complications, especially in pregnant women, as it can cause a premature delivery. CAUSES  Bacterial vaginosis is caused by an increase in harmful bacteria that are normally present in smaller amounts in the vagina. Several different kinds of bacteria can cause bacterial vaginosis. However, the reason that the condition develops is not fully understood. RISK FACTORS Certain activities or behaviors can put you at an increased risk of developing bacterial vaginosis, including:  Having a new sex partner or multiple sex partners.  Douching.  Using an intrauterine device (IUD) for contraception. Women do not get bacterial vaginosis from toilet seats, bedding, swimming pools, or contact with objects around them. SIGNS AND SYMPTOMS  Some women with bacterial vaginosis have no signs or symptoms. Common symptoms include:  Grey vaginal discharge.  A fishlike odor with discharge, especially after sexual intercourse.  Itching or burning of the vagina and vulva.  Burning or pain with urination. DIAGNOSIS  Your health care provider will take a medical history and examine the vagina for signs of bacterial vaginosis. A sample of vaginal fluid may be taken. Your health care provider will look at this sample under a microscope to check for bacteria and abnormal cells. A vaginal pH test may also be done.  TREATMENT  Bacterial vaginosis may be treated with antibiotic medicines. These may be given in the form of a pill or a vaginal cream. A second round of antibiotics may be prescribed if the condition comes back after treatment.  HOME CARE INSTRUCTIONS   Only take over-the-counter or prescription medicines as  directed by your health care provider.  If antibiotic medicine was prescribed, take it as directed. Make sure you finish it even if you start to feel better.  Do not have sex until treatment is completed.  Tell all sexual partners that you have a vaginal infection. They should see their health care provider and be treated if they have problems, such as a mild rash or itching.  Practice safe sex by using condoms and only having one sex partner. SEEK MEDICAL CARE IF:   Your symptoms are not improving after 3 days of treatment.  You have increased discharge or pain.  You have a fever. MAKE SURE YOU:   Understand these instructions.  Will watch your condition.  Will get help right away if you are not doing well or get worse. FOR MORE INFORMATION  Centers for Disease Control and Prevention, Division of STD Prevention: www.cdc.gov/std American Sexual Health Association (ASHA): www.ashastd.org  Document Released: 09/27/2005 Document Revised: 07/18/2013 Document Reviewed: 05/09/2013 ExitCare Patient Information 2015 [employer]. This information is not intended to replace advice given to you by your health care provider. Make sure you discuss any questions you have with your health care provider.  

## 2015-04-26 ENCOUNTER — Encounter (HOSPITAL_COMMUNITY): Payer: Self-pay | Admitting: *Deleted

## 2015-04-26 ENCOUNTER — Inpatient Hospital Stay (HOSPITAL_COMMUNITY)
Admission: EM | Admit: 2015-04-26 | Discharge: 2015-04-26 | Disposition: A | Discharge status: Home / Self Care | Payer: Medicaid - Out of State | Source: Ambulatory Visit | Attending: Obstetrics & Gynecology | Admitting: Obstetrics & Gynecology

## 2015-04-26 DIAGNOSIS — O3411 Maternal care for benign tumor of corpus uteri, first trimester: Secondary | ICD-10-CM | POA: Diagnosis not present

## 2015-04-26 DIAGNOSIS — K5901 Slow transit constipation: Secondary | ICD-10-CM | POA: Diagnosis not present

## 2015-04-26 DIAGNOSIS — R102 Pelvic and perineal pain: Secondary | ICD-10-CM | POA: Insufficient documentation

## 2015-04-26 DIAGNOSIS — R109 Unspecified abdominal pain: Secondary | ICD-10-CM | POA: Diagnosis present

## 2015-04-26 DIAGNOSIS — N949 Unspecified condition associated with female genital organs and menstrual cycle: Secondary | ICD-10-CM | POA: Diagnosis not present

## 2015-04-26 DIAGNOSIS — Z3A12 12 weeks gestation of pregnancy: Secondary | ICD-10-CM | POA: Diagnosis not present

## 2015-04-26 DIAGNOSIS — Z87891 Personal history of nicotine dependence: Secondary | ICD-10-CM | POA: Insufficient documentation

## 2015-04-26 DIAGNOSIS — O9989 Other specified diseases and conditions complicating pregnancy, childbirth and the puerperium: Secondary | ICD-10-CM | POA: Diagnosis not present

## 2015-04-26 DIAGNOSIS — K5900 Constipation, unspecified: Secondary | ICD-10-CM | POA: Insufficient documentation

## 2015-04-26 DIAGNOSIS — D259 Leiomyoma of uterus, unspecified: Secondary | ICD-10-CM | POA: Diagnosis not present

## 2015-04-26 LAB — CULTURE, OB URINE

## 2015-04-26 MED ORDER — CYCLOBENZAPRINE HCL 10 MG PO TABS: 10.0000 mg | ORAL_TABLET | Freq: Three times a day (TID) | ORAL | Status: DC | PRN

## 2015-04-26 MED ORDER — POLYETHYLENE GLYCOL 3350 17 GM/SCOOP PO POWD: ORAL | Status: DC

## 2015-04-26 MED ORDER — OXYCODONE-ACETAMINOPHEN 5-325 MG PO TABS
2.0000 | ORAL_TABLET | Freq: Once | ORAL | Status: AC
  Administered 2015-04-26: 2 via ORAL
  Filled 2015-04-26: qty 2

## 2015-04-26 MED ORDER — DOCUSATE SODIUM 250 MG PO CAPS: 250.0000 mg | ORAL_CAPSULE | Freq: Every day | ORAL | Status: DC

## 2015-04-26 NOTE — MAU Note (Signed)
Pt states she was here yesterday said she has a subchorionic hemorrhage and a fibroid and had some spotting.  Had some spotting earlier today and left sided pain now across lower abdomen and lower back that has just gotten a bit worse.

## 2015-04-26 NOTE — MAU Provider Note (Signed)
History     CSN: [id number]  Arrival date and time: 04/26/15 1801   First Provider Initiated Contact with Patient 04/26/15 1818      Chief Complaint  Patient presents with  . Abdominal Cramping   HPI  Joyce Rivers is a 29 y.o. [phone] at [redacted]w[redacted]d who presents to MAU today with complaint of abdominal pain. The patient was seen in MAU yesterday with the same complaint. She was given Flexeril and Flagyl. She took Flexeril at 0900 this morning without relief. She rates her pain at 10/10 now. She states pain is located in the LLQ, midline suprapubic area and low back pain. She also complains of associated N/V and constipation. Last BM was 3 days ago. She states increased pain with attempt to have a bowel movement. The patient has not taken anything to help with her bowel movements. She denies vaginal bleeding today, she did have spotting 3 days ago. She has a known [place]. She also has known uterine fibroids.   OB History    Gravida Para Term Preterm AB TAB SAB Ectopic Multiple Living   4 1 1  2  2   1       Past Medical History  Diagnosis Date  . Cyst, breast   . Pregnancy induced hypertension 2007  . Medical history non-contributory   . Infection     UTI  . Depression     pt denies 7/6    Past Surgical History  Procedure Laterality Date  . Breast biopsy    . Wisdom tooth extraction      Family History  Problem Relation Age of Onset  . Asthma Sister   . Asthma Son   . Cancer Paternal Grandmother     breast  . Heart disease Paternal Grandmother   . Diabetes Neg Hx   . Hypertension Neg Hx     History  Substance Use Topics  . Smoking status: Former Smoker    Quit date: 02/10/2015  . Smokeless tobacco: Never Used  . Alcohol Use: No     Comment: not while pregnant    Allergies: No Known Allergies  Prescriptions prior to admission  Medication Sig Dispense Refill Last Dose  . cyclobenzaprine (FLEXERIL) 10 MG tablet Take 1 tablet (10 mg total) by mouth 3 (three)  times daily as needed for muscle spasms. 20 tablet 0 04/26/2015 at 0900  . famotidine (PEPCID) 20 MG tablet Take 1 tablet (20 mg total) by mouth 2 (two) times daily. 30 tablet 3 04/26/2015 at Unknown time  . metoCLOPramide (REGLAN) 10 MG tablet Take 1 tablet (10 mg total) by mouth every 6 (six) hours. (Patient taking differently: Take 10 mg by mouth every 6 (six) hours as needed for nausea or vomiting. ) 90 tablet 1 04/26/2015 at Unknown time  . metroNIDAZOLE (FLAGYL) 500 MG tablet Take 1 tablet (500 mg total) by mouth 2 (two) times daily. 14 tablet 0 04/26/2015 at Unknown time  . promethazine (PHENERGAN) 25 MG tablet Take 1 tablet (25 mg total) by mouth every 6 (six) hours as needed for nausea. (Patient taking differently: Take 25 mg by mouth at bedtime as needed for nausea or vomiting. ) 30 tablet 1 04/25/2015 at Unknown time    Review of Systems  Constitutional: Negative for fever and malaise/fatigue.  Gastrointestinal: Positive for nausea, abdominal pain and constipation. Negative for vomiting and diarrhea.  Genitourinary: Negative for dysuria, urgency and frequency.       Neg - vaginal bleeding, discharge  Physical Exam   Blood pressure 125/80, pulse 105, temperature 98.1 F (36.7 C), temperature source Oral, resp. rate 18, last menstrual period 01/30/2015.  Physical Exam  Nursing note and vitals reviewed. Constitutional: She is oriented to person, place, and time. She appears well-developed and well-nourished. No distress.  HENT:  Head: Normocephalic and atraumatic.  Cardiovascular: Normal rate.   Respiratory: Effort normal.  GI: Soft. Bowel sounds are normal. She exhibits no distension and no mass. There is tenderness (mild tenderness to palpation of the LLQ ). There is no rebound and no guarding.  Neurological: She is alert and oriented to person, place, and time.  Skin: Skin is warm and dry. No erythema.  Psychiatric: She has a normal mood and affect.  Dilation: Closed Effacement  (%): Thick Exam by:: Kerry Hough, PA   MAU Course  Procedures  MDM Patient seen in MAU yesterday, notes and results reviewed 2 percocet given in MAU Offered Enema in MAU vs bowel regimen at home. Patient would prefer medications for home.  Assessment and Plan  A: SIUP at 106w2d Round ligament pain Uterine fibroids Constipation  P: Discharge home Rx for Miralax, Colace and enema given to patient Second trimester precautions discussed Increased PO hydration and dietary fiber recommended Discussed use of abdominal binder, warm bath/shower and Tylenol PRN as well as continued Flexeril for round ligament pain Patient advised to follow-up with [facility] as scheduled to start prenatal care Patient may return to MAU as needed or if her condition were to change or worsen  Luvenia Redden, PA-C 04/26/2015, 7:00 PM

## 2015-04-26 NOTE — MAU Note (Signed)
Urine Sent To Lab 

## 2015-04-26 NOTE — Discharge Instructions (Signed)
Constipation Constipation is when a person:  Poops (has a bowel movement) less than 3 times a week.  Has a hard time pooping.  Has poop that is dry, hard, or bigger than normal. HOME CARE   Eat foods with a lot of fiber in them. This includes fruits, vegetables, beans, and whole grains such as brown rice.  Avoid fatty foods and foods with a lot of sugar. This includes french fries, hamburgers, cookies, candy, and soda.  If you are not getting enough fiber from food, take products with added fiber in them (supplements).  Drink enough fluid to keep your pee (urine) clear or pale yellow.  Exercise on a regular basis, or as told by your doctor.  Go to the restroom when you feel like you need to poop. Do not hold it.  Only take medicine as told by your doctor. Do not take medicines that help you poop (laxatives) without talking to your doctor first. GET HELP RIGHT AWAY IF:   You have bright red blood in your poop (stool).  Your constipation lasts more than 4 days or gets worse.  You have belly (abdominal) or butt (rectal) pain.  You have thin poop (as thin as a pencil).  You lose weight, and it cannot be explained. MAKE SURE YOU:   Understand these instructions.  Will watch your condition.  Will get help right away if you are not doing well or get worse. Document Released: 03/15/2008 Document Revised: 10/02/2013 Document Reviewed: 07/09/2013 [facility] Patient Information 2015 [place], [place]. This information is not intended to replace advice given to you by your health care provider. Make sure you discuss any questions you have with your health care provider. High-Fiber Diet Fiber is found in fruits, vegetables, and grains. A high-fiber diet encourages the addition of more whole grains, legumes, fruits, and vegetables in your diet. The recommended amount of fiber for adult males is 38 g per day. For adult females, it is 25 g per day. Pregnant and lactating women should get 28 g  of fiber per day. If you have a digestive or bowel problem, ask your caregiver for advice before adding high-fiber foods to your diet. Eat a variety of high-fiber foods instead of only a select few type of foods.  PURPOSE  To increase stool bulk.  To make bowel movements more regular to prevent constipation.  To lower cholesterol.  To prevent overeating. WHEN IS THIS DIET USED?  It may be used if you have constipation and hemorrhoids.  It may be used if you have uncomplicated diverticulosis (intestine condition) and irritable bowel syndrome.  It may be used if you need help with weight management.  It may be used if you want to add it to your diet as a protective measure against atherosclerosis, diabetes, and cancer. SOURCES OF FIBER  Whole-grain breads and cereals.  Fruits, such as apples, oranges, bananas, berries, prunes, and pears.  Vegetables, such as green peas, carrots, sweet potatoes, beets, broccoli, cabbage, spinach, and artichokes.  Legumes, such split peas, soy, lentils.  Almonds. FIBER CONTENT IN FOODS Starches and Grains / Dietary Fiber (g)  Cheerios, 1 cup / 3 g  Corn Flakes cereal, 1 cup / 0.7 g  Rice crispy treat cereal, 1 cup / 0.3 g  Instant oatmeal (cooked),  cup / 2 g  Frosted wheat cereal, 1 cup / 5.1 g  Brown, long-grain rice (cooked), 1 cup / 3.5 g  White, long-grain rice (cooked), 1 cup / 0.6 g  Enriched  macaroni (cooked), 1 cup / 2.5 g Legumes / Dietary Fiber (g)  Baked beans (canned, plain, or vegetarian),  cup / 5.2 g  Kidney beans (canned),  cup / 6.8 g  Pinto beans (cooked),  cup / 5.5 g Breads and Crackers / Dietary Fiber (g)  Plain or honey graham crackers, 2 squares / 0.7 g  Saltine crackers, 3 squares / 0.3 g  Plain, salted pretzels, 10 pieces / 1.8 g  Whole-wheat bread, 1 slice / 1.9 g  White bread, 1 slice / 0.7 g  Raisin bread, 1 slice / 1.2 g  Plain bagel, 3 oz / 2 g  Flour tortilla, 1 oz / 0.9 g  Corn  tortilla, 1 small / 1.5 g  Hamburger or hotdog bun, 1 small / 0.9 g Fruits / Dietary Fiber (g)  Apple with skin, 1 medium / 4.4 g  Sweetened applesauce,  cup / 1.5 g  Banana,  medium / 1.5 g  Grapes, 10 grapes / 0.4 g  Orange, 1 small / 2.3 g  Raisin, 1.5 oz / 1.6 g  Melon, 1 cup / 1.4 g Vegetables / Dietary Fiber (g)  Green beans (canned),  cup / 1.3 g  Carrots (cooked),  cup / 2.3 g  Broccoli (cooked),  cup / 2.8 g  Peas (cooked),  cup / 4.4 g  Mashed potatoes,  cup / 1.6 g  Lettuce, 1 cup / 0.5 g  Corn (canned),  cup / 1.6 g  Tomato,  cup / 1.1 g Document Released: 09/27/2005 Document Revised: 03/28/2012 Document Reviewed: 12/30/2011 [employer] Patient Information 2015 [place], [place]. This information is not intended to replace advice given to you by your health care provider. Make sure you discuss any questions you have with your health care provider.   Round Ligament Pain During Pregnancy   Round ligament pain is a sharp pain or jabbing feeling often felt in the lower belly or groin area on one or both sides. It is one of the most common complaints during pregnancy and is considered a normal part of pregnancy. It is most often felt during the second trimester.   Here is what you need to know about round ligament pain, including some tips to help you feel better.   Causes of Round Ligament Pain:    Several thick ligaments surround and support your womb (uterus) as it grows during pregnancy. One of them is called the round ligament.   The round ligament connects the front part of the womb to your groin, the area where your legs attach to your pelvis. The round ligament normally tightens and relaxes slowly.   As your baby and womb grow, the round ligament stretches. That makes it more likely to become strained.   Sudden movements can cause the ligament to tighten quickly, like a rubber band snapping. This causes a sudden and quick jabbing feeling.    Symptoms of Round Ligament Pain   Round ligament pain can be concerning and uncomfortable. But it is considered normal as your body changes during pregnancy.   The symptoms of round ligament pain include a sharp, sudden spasm in the belly. It usually affects the right side, but it may happen on both sides. The pain only lasts a few seconds.   Exercise may cause the pain, as will rapid movements such as:   sneezing  coughing  laughing  rolling over in bed  standing up too quickly   Treatment of Round Ligament Pain   Here are  some tips that may help reduce your discomfort:   Pain relief. Take over-the-counter acetaminophen for pain, if necessary. Ask your doctor if this is OK.   Exercise. Get plenty of exercise to keep your stomach (core) muscles strong. Doing stretching exercises or prenatal yoga can be helpful. Ask your doctor which exercises are safe for you and your baby.   A helpful exercise involves putting your hands and knees on the floor, lowering your head, and pushing your backside into the air.   Avoid sudden movements. Change positions slowly (such as standing up or sitting down) to avoid sudden movements that may cause stretching and pain.   Flex your hips. Bend and flex your hips before you cough, sneeze, or laugh to avoid pulling on the ligaments.   Apply warmth. A heating pad or warm bath may be helpful. Ask your doctor if this is OK. Extreme heat can be dangerous to the baby.   You should try to modify your daily activity level and avoid positions that may worsen the condition.   When to Call the Doctor/Midwife   Always tell your doctor or midwife about any type of pain you have during pregnancy. Round ligament pain is quick and doesn't last long.   Call your health care provider immediately if you have:   severe pain  fever  chills  pain on urination  difficulty walking   Belly pain during pregnancy can be due to many different causes. It is important  for your doctor to rule out more serious conditions, including pregnancy complications such as placenta abruption or non-pregnancy illnesses such as:   inguinal hernia  appendicitis  stomach, liver, and kidney problems  Preterm labor pains may sometimes be mistaken for round ligament pain.

## 2015-04-28 ENCOUNTER — Encounter: Payer: 59 | Admitting: Advanced Practice Midwife

## 2015-04-28 LAB — GC/CHLAMYDIA PROBE AMP (~~LOC~~) NOT AT ARMC
Chlamydia: NEGATIVE
Neisseria Gonorrhea: NEGATIVE

## 2015-05-12 ENCOUNTER — Encounter: Payer: 59 | Admitting: Advanced Practice Midwife

## 2015-05-27 ENCOUNTER — Other Ambulatory Visit (HOSPITAL_COMMUNITY)
Admission: RE | Admit: 2015-05-27 | Discharge: 2015-05-27 | Disposition: A | Discharge status: Home / Self Care | Payer: Medicaid Other | Source: Ambulatory Visit | Attending: Obstetrics and Gynecology | Admitting: Obstetrics and Gynecology

## 2015-05-27 ENCOUNTER — Encounter: Payer: Self-pay | Admitting: Obstetrics and Gynecology

## 2015-05-27 ENCOUNTER — Ambulatory Visit (INDEPENDENT_AMBULATORY_CARE_PROVIDER_SITE_OTHER): Payer: Medicaid Other | Admitting: Obstetrics and Gynecology

## 2015-05-27 VITALS — BP 118/71 | HR 87 | Temp 98.1°F | Wt 200.7 lb

## 2015-05-27 DIAGNOSIS — Z3482 Encounter for supervision of other normal pregnancy, second trimester: Secondary | ICD-10-CM | POA: Diagnosis not present

## 2015-05-27 DIAGNOSIS — Z1151 Encounter for screening for human papillomavirus (HPV): Secondary | ICD-10-CM | POA: Diagnosis present

## 2015-05-27 DIAGNOSIS — O3412 Maternal care for benign tumor of corpus uteri, second trimester: Secondary | ICD-10-CM

## 2015-05-27 DIAGNOSIS — Z01419 Encounter for gynecological examination (general) (routine) without abnormal findings: Secondary | ICD-10-CM | POA: Insufficient documentation

## 2015-05-27 DIAGNOSIS — O218 Other vomiting complicating pregnancy: Secondary | ICD-10-CM | POA: Diagnosis not present

## 2015-05-27 DIAGNOSIS — O3411 Maternal care for benign tumor of corpus uteri, first trimester: Secondary | ICD-10-CM | POA: Diagnosis not present

## 2015-05-27 DIAGNOSIS — D259 Leiomyoma of uterus, unspecified: Secondary | ICD-10-CM | POA: Insufficient documentation

## 2015-05-27 DIAGNOSIS — Z124 Encounter for screening for malignant neoplasm of cervix: Secondary | ICD-10-CM | POA: Diagnosis not present

## 2015-05-27 DIAGNOSIS — O99281 Endocrine, nutritional and metabolic diseases complicating pregnancy, first trimester: Secondary | ICD-10-CM | POA: Diagnosis not present

## 2015-05-27 DIAGNOSIS — O211 Hyperemesis gravidarum with metabolic disturbance: Secondary | ICD-10-CM

## 2015-05-27 LAB — POCT URINALYSIS DIP (DEVICE)
GLUCOSE, UA: NEGATIVE mg/dL
Nitrite: NEGATIVE
Protein, ur: 30 mg/dL — AB
SPECIFIC GRAVITY, URINE: 1.02 (ref 1.005–1.030)
UROBILINOGEN UA: 1 mg/dL (ref 0.0–1.0)
pH: 7 (ref 5.0–8.0)

## 2015-05-27 MED ORDER — PRENATAL PLUS 27-1 MG PO TABS: 1.0000 | ORAL_TABLET | Freq: Every day | ORAL | Status: DC

## 2015-05-27 NOTE — Patient Instructions (Signed)
Uterine Fibroid A uterine fibroid is a growth (tumor) that occurs in your uterus. This type of tumor is not cancerous and does not spread out of the uterus. You can have one or many fibroids. Fibroids can vary in size, weight, and where they grow in the uterus. Some can become quite large. Most fibroids do not require medical treatment, but some can cause pain or heavy bleeding during and between periods. CAUSES  A fibroid is the result of a single uterine cell that keeps growing (unregulated), which is different than most cells in the human body. Most cells have a control mechanism that keeps them from reproducing without control.  SIGNS AND SYMPTOMS   Bleeding.  Pelvic pain and pressure.  Bladder problems due to the size of the fibroid.  Infertility and miscarriages depending on the size and location of the fibroid. DIAGNOSIS  Uterine fibroids are diagnosed through a physical exam. Your health care provider may feel the lumpy tumors during a pelvic exam. Ultrasonography may be done to get information regarding size, location, and number of tumors.  TREATMENT   Your health care provider may recommend watchful waiting. This involves getting the fibroid checked by your health care provider to see if it grows or shrinks.   Hormone treatment or an intrauterine device (IUD) may be prescribed.   Surgery may be needed to remove the fibroids (myomectomy) or the uterus (hysterectomy). This depends on your situation. When fibroids interfere with fertility and a woman wants to become pregnant, a health care provider may recommend having the fibroids removed.  [place] care depends on how you were treated. In general:   Keep all follow-up appointments with your health care provider.   Only take over-the-counter or prescription medicines as directed by your health care provider. If you were prescribed a hormone treatment, take the hormone medicines exactly as directed. Do not  take aspirin. It can cause bleeding.   Talk to your health care provider about taking iron pills.  If your periods are troublesome but not so heavy, lie down with your feet raised slightly above your heart. Place cold packs on your lower abdomen.   If your periods are heavy, write down the number of pads or tampons you use per month. Bring this information to your health care provider.   Include green vegetables in your diet.  SEEK IMMEDIATE MEDICAL CARE IF:  You have pelvic pain or cramps not controlled with medicines.   You have a sudden increase in pelvic pain.   You have an increase in bleeding between and during periods.   You have excessive periods and soak tampons or pads in a half hour or less.  You feel lightheaded or have fainting episodes. Document Released: 09/24/2000 Document Revised: 07/18/2013 Document Reviewed: 04/26/2013 [facility] Patient Information 2015 [place], [place]. This information is not intended to replace advice given to you by your health care provider. Make sure you discuss any questions you have with your health care provider. Second Trimester of Pregnancy The second trimester is from week 13 through week 28, months 4 through 6. The second trimester is often a time when you feel your best. Your body has also adjusted to being pregnant, and you begin to feel better physically. Usually, morning sickness has lessened or quit completely, you may have more energy, and you may have an increase in appetite. The second trimester is also a time when the fetus is growing rapidly. At the end of the sixth month,  the fetus is about 9 inches long and weighs about 1 pounds. You will likely begin to feel the baby move (quickening) between 18 and 20 weeks of the pregnancy. BODY CHANGES Your body goes through many changes during pregnancy. The changes vary from woman to woman.   Your weight will continue to increase. You will notice your lower abdomen bulging out.  You  may begin to get stretch marks on your hips, abdomen, and breasts.  You may develop headaches that can be relieved by medicines approved by your health care provider.  You may urinate more often because the fetus is pressing on your bladder.  You may develop or continue to have heartburn as a result of your pregnancy.  You may develop constipation because certain hormones are causing the muscles that push waste through your intestines to slow down.  You may develop hemorrhoids or swollen, bulging veins (varicose veins).  You may have back pain because of the weight gain and pregnancy hormones relaxing your joints between the bones in your pelvis and as a result of a shift in weight and the muscles that support your balance.  Your breasts will continue to grow and be tender.  Your gums may bleed and may be sensitive to brushing and flossing.  Dark spots or blotches (chloasma, mask of pregnancy) may develop on your face. This will likely fade after the baby is born.  A dark line from your belly button to the pubic area (linea nigra) may appear. This will likely fade after the baby is born.  You may have changes in your hair. These can include thickening of your hair, rapid growth, and changes in texture. Some women also have hair loss during or after pregnancy, or hair that feels dry or thin. Your hair will most likely return to normal after your baby is born. WHAT TO EXPECT AT YOUR PRENATAL VISITS During a routine prenatal visit:  You will be weighed to make sure you and the fetus are growing normally.  Your blood pressure will be taken.  Your abdomen will be measured to track your baby's growth.  The fetal heartbeat will be listened to.  Any test results from the previous visit will be discussed. Your health care provider may ask you:  How you are feeling.  If you are feeling the baby move.  If you have had any abnormal symptoms, such as leaking fluid, bleeding, severe  headaches, or abdominal cramping.  If you have any questions. Other tests that may be performed during your second trimester include:  Blood tests that check for:  Low iron levels (anemia).  Gestational diabetes (between 24 and 28 weeks).  Rh antibodies.  Urine tests to check for infections, diabetes, or protein in the urine.  An ultrasound to confirm the proper growth and development of the baby.  An amniocentesis to check for possible genetic problems.  Fetal screens for spina bifida and Down syndrome. HOME CARE INSTRUCTIONS   Avoid all smoking, herbs, alcohol, and unprescribed drugs. These chemicals affect the formation and growth of the baby.  Follow your health care provider's instructions regarding medicine use. There are medicines that are either safe or unsafe to take during pregnancy.  Exercise only as directed by your health care provider. Experiencing uterine cramps is a good sign to stop exercising.  Continue to eat regular, healthy meals.  Wear a good support bra for breast tenderness.  Do not use hot tubs, steam rooms, or saunas.  Wear your seat belt  at all times when driving.  Avoid raw meat, uncooked cheese, cat litter boxes, and soil used by cats. These carry germs that can cause birth defects in the baby.  Take your prenatal vitamins.  Try taking a stool softener (if your health care provider approves) if you develop constipation. Eat more high-fiber foods, such as fresh vegetables or fruit and whole grains. Drink plenty of fluids to keep your urine clear or pale yellow.  Take warm sitz baths to soothe any pain or discomfort caused by hemorrhoids. Use hemorrhoid cream if your health care provider approves.  If you develop varicose veins, wear support hose. Elevate your feet for 15 minutes, 3-4 times a day. Limit salt in your diet.  Avoid heavy lifting, wear low heel shoes, and practice good posture.  Rest with your legs elevated if you have leg cramps  or low back pain.  Visit your dentist if you have not gone yet during your pregnancy. Use a soft toothbrush to brush your teeth and be gentle when you floss.  A sexual relationship may be continued unless your health care provider directs you otherwise.  Continue to go to all your prenatal visits as directed by your health care provider. SEEK MEDICAL CARE IF:   You have dizziness.  You have mild pelvic cramps, pelvic pressure, or nagging pain in the abdominal area.  You have persistent nausea, vomiting, or diarrhea.  You have a bad smelling vaginal discharge.  You have pain with urination. SEEK IMMEDIATE MEDICAL CARE IF:   You have a fever.  You are leaking fluid from your vagina.  You have spotting or bleeding from your vagina.  You have severe abdominal cramping or pain.  You have rapid weight gain or loss.  You have shortness of breath with chest pain.  You notice sudden or extreme swelling of your face, hands, ankles, feet, or legs.  You have not felt your baby move in over an hour.  You have severe headaches that do not go away with medicine.  You have vision changes. Document Released: 09/21/2001 Document Revised: 10/02/2013 Document Reviewed: 11/28/2012 [facility] Patient Information 2015 [place], [place]. This information is not intended to replace advice given to you by your health care provider. Make sure you discuss any questions you have with your health care provider.

## 2015-05-27 NOTE — Progress Notes (Signed)
Subjective:    Joyce Rivers is a [id number] [redacted]w[redacted]d being seen today for her first obstetrical visit.  Her obstetrical history is significant for fibroid uterufibroid uterus; N/V of pregnnacy with borderline TFTs.. Patient does intend to breast feed. Pregnancy history fully reviewed.  Patient reports no complaints. Unaware of fibroids before [place] this pregnancy. No bleeding or cramping. Still occasional vomiting but Phenergan effective. Not yet on PNVs. Pregnancy unplanned but desired. Has 30 y/o and does not work.   Filed Vitals:   05/27/15 0804  BP: 118/71  Pulse: 87  Temp: 98.1 F (36.7 C)  Weight: 200 lb 11.2 oz (91.037 kg)    HISTORY: OB History  Gravida Para Term Preterm AB SAB TAB Ectopic Multiple Living  4 1 1  2 2    1     # Outcome Date GA Lbr Len/2nd Weight Sex Delivery Anes PTL Lv  4 Current           3 SAB 2013 [redacted]w[redacted]d         2 SAB 2011 [redacted]w[redacted]d         1 Term 07/20/06 [redacted]w[redacted]d  7 lb (3.175 kg) F Vag-Spont EPI N Y     Past Medical History  Diagnosis Date  . Cyst, breast   . Pregnancy induced hypertension 2007  . Medical history non-contributory   . Infection     UTI  . Depression     pt denies 7/6  . Vaginal Pap smear, abnormal    Past Surgical History  Procedure Laterality Date  . Breast biopsy    . Wisdom tooth extraction     Family History  Problem Relation Age of Onset  . Asthma Sister   . Asthma Son   . Cancer Paternal Grandmother     breast  . Heart disease Paternal Grandmother   . Diabetes Neg Hx   . Hypertension Neg Hx      Exam    Uterus:     Pelvic Exam:    Perineum: Normal Perineum   Vulva: normal, Bartholin's, Urethra, Skene's normal   Vagina:  normal discharge       Cervix: no bleeding following Pap and no lesions   Adnexa: not evaluated   Bony Pelvis: average  System: Breast:  normal appearance, no masses or tenderness, Inspection negative   Skin: normal coloration and turgor, no rashes    Neurologic: grossly non-focal,  oriented, normal mood   Extremities: normal strength, tone, and muscle mass, no deformities   HEENT PERRLA, neck supple with midline trachea and thyroid without masses   Mouth/Teeth mucous membranes moist, pharynx normal without lesions and dental hygiene good   Neck supple   Cardiovascular: regular rate and rhythm, no murmurs or gallops   Respiratory:  appears well, vitals normal, no respiratory distress, acyanotic, normal RR, ear and throat exam is normal, neck free of mass or lymphadenopathy, chest clear, no wheezing, crepitations, rhonchi, normal symmetric air entry   Abdomen: soft, non-tender; bowel sounds normal; no masses,  no organomegaly   Urinary: urethral meatus normal      Assessment:    Pregnancy: [id number] Patient Active Problem List   Diagnosis Date Noted  . Uterine fibroids affecting pregnancy in second trimester 05/27/2015  . Thyroid dysfunction in pregnancy in first trimester 04/05/2015  . Hyperemesis gravidarum before end of [redacted] week gestation, dehydration 03/15/2015        Plan:     Initial labs drawn. Prenatal vitamins. Problem list reviewed and updated.  Genetic Screening discussed Quad Screen: ordered.  Ultrasound discussed; fetal survey: ordered.  Follow up in 4 weeks. 50% of 30 min visit spent on counseling and coordination of care.  TSH sent Counseled on RF d/t fibroids and to report pain, PTL sx or bleeding.   Shiah Berhow 05/27/2015

## 2015-05-27 NOTE — Progress Notes (Signed)
Early glucola due to BMI >30 Weight gain 11-20lbs New ob packet given [facility] scheduled for September a @0830  [facility] prescription filled out for ensure and given to patient

## 2015-05-28 LAB — PRENATAL PROFILE (SOLSTAS)
Antibody Screen: NEGATIVE
BASOS PCT: 0 % (ref 0–1)
Basophils Absolute: 0 10*3/uL (ref 0.0–0.1)
EOS PCT: 3 % (ref 0–5)
Eosinophils Absolute: 0.2 10*3/uL (ref 0.0–0.7)
HEMATOCRIT: 30.6 % — AB (ref 36.0–46.0)
HEMOGLOBIN: 9.9 g/dL — AB (ref 12.0–15.0)
HIV 1&2 Ab, 4th Generation: NONREACTIVE
Hepatitis B Surface Ag: NEGATIVE
Lymphocytes Relative: 21 % (ref 12–46)
Lymphs Abs: 1.6 10*3/uL (ref 0.7–4.0)
MCH: 27 pg (ref 26.0–34.0)
MCHC: 32.4 g/dL (ref 30.0–36.0)
MCV: 83.4 fL (ref 78.0–100.0)
MONO ABS: 0.5 10*3/uL (ref 0.1–1.0)
MPV: 9 fL (ref 8.6–12.4)
Monocytes Relative: 6 % (ref 3–12)
Neutro Abs: 5.5 10*3/uL (ref 1.7–7.7)
Neutrophils Relative %: 70 % (ref 43–77)
Platelets: 391 10*3/uL (ref 150–400)
RBC: 3.67 MIL/uL — AB (ref 3.87–5.11)
RDW: 14.6 % (ref 11.5–15.5)
RH TYPE: POSITIVE
Rubella: 1.74 Index — ABNORMAL HIGH (ref <0.90)
WBC: 7.8 10*3/uL (ref 4.0–10.5)

## 2015-05-28 LAB — CYTOLOGY - PAP

## 2015-05-28 LAB — AFP, QUAD SCREEN
AFP: 61.1 ng/mL
Age Alone: 1:699 {titer}
CURR GEST AGE: 17.4 wks.days
HCG, Total: 27.14 IU/mL
INH: 220 pg/mL
INTERPRETATION-AFP: NEGATIVE
MOM FOR INH: 1.56
MoM for AFP: 1.92
MoM for hCG: 1.12
Open Spina bifida: NEGATIVE
Tri 18 Scr Risk Est: NEGATIVE
Trisomy 18 (Edward) Syndrome Interp.: 1:23400 {titer}
UE3 MOM: 0.68
UE3 VALUE: 0.75 ng/mL

## 2015-05-28 LAB — CULTURE, OB URINE
Colony Count: NO GROWTH
ORGANISM ID, BACTERIA: NO GROWTH

## 2015-05-28 LAB — GLUCOSE TOLERANCE, 1 HOUR (50G) W/O FASTING: Glucose, 1 Hour GTT: 77 mg/dL (ref 70–140)

## 2015-05-28 LAB — TSH: TSH: 0.443 u[IU]/mL (ref 0.350–4.500)

## 2015-05-31 LAB — CANNABANOIDS (GC/LC/MS), URINE: THC-COOH (GC/LC/MS), ur confirm: 710 ng/mL — AB (ref <5)

## 2015-06-02 LAB — PRESCRIPTION MONITORING PROFILE (19 PANEL)
Amphetamine/Meth: NEGATIVE ng/mL
BENZODIAZEPINE SCREEN, URINE: NEGATIVE ng/mL
BUPRENORPHINE, URINE: NEGATIVE ng/mL
Barbiturate Screen, Urine: NEGATIVE ng/mL
COCAINE METABOLITES: NEGATIVE ng/mL
CREATININE, URINE: 263.27 mg/dL (ref >20.0)
Carisoprodol, Urine: NEGATIVE ng/mL
ECSTASY: NEGATIVE ng/mL
FENTANYL URINE: NEGATIVE ng/mL
METHAQUALONE SCREEN (URINE): NEGATIVE ng/mL
Meperidine, Ur: NEGATIVE ng/mL
Methadone Screen, Urine: NEGATIVE ng/mL
Nitrites, Initial: NEGATIVE ug/mL
Opiate Screen, Urine: NEGATIVE ng/mL
Oxycodone Screen, Ur: NEGATIVE ng/mL
PH URINE, INITIAL: 7.3 pH (ref 4.5–8.9)
Phencyclidine, Ur: NEGATIVE ng/mL
Propoxyphene: NEGATIVE ng/mL
TRAMADOL UR: NEGATIVE ng/mL
Tapentadol, urine: NEGATIVE ng/mL
ZOLPIDEM, URINE: NEGATIVE ng/mL

## 2015-06-10 ENCOUNTER — Inpatient Hospital Stay (HOSPITAL_COMMUNITY)
Admission: AD | Admit: 2015-06-10 | Discharge: 2015-06-11 | DRG: 779 | Disposition: A | Discharge status: Home / Self Care | Payer: Medicaid Other | Source: Ambulatory Visit | Attending: Obstetrics & Gynecology | Admitting: Obstetrics & Gynecology

## 2015-06-10 ENCOUNTER — Encounter (HOSPITAL_COMMUNITY): Payer: Self-pay

## 2015-06-10 DIAGNOSIS — O039 Complete or unspecified spontaneous abortion without complication: Secondary | ICD-10-CM | POA: Diagnosis not present

## 2015-06-10 DIAGNOSIS — R109 Unspecified abdominal pain: Secondary | ICD-10-CM | POA: Diagnosis present

## 2015-06-10 DIAGNOSIS — Z3A18 18 weeks gestation of pregnancy: Secondary | ICD-10-CM | POA: Diagnosis present

## 2015-06-10 DIAGNOSIS — Z87891 Personal history of nicotine dependence: Secondary | ICD-10-CM

## 2015-06-10 LAB — URINE MICROSCOPIC-ADD ON

## 2015-06-10 LAB — CBC WITH DIFFERENTIAL/PLATELET
BASOS ABS: 0 10*3/uL (ref 0.0–0.1)
BASOS PCT: 0 % (ref 0–1)
Eosinophils Absolute: 0 10*3/uL (ref 0.0–0.7)
Eosinophils Relative: 0 % (ref 0–5)
HEMATOCRIT: 28.8 % — AB (ref 36.0–46.0)
HEMOGLOBIN: 9.8 g/dL — AB (ref 12.0–15.0)
LYMPHS PCT: 5 % — AB (ref 12–46)
Lymphs Abs: 1 10*3/uL (ref 0.7–4.0)
MCH: 28.2 pg (ref 26.0–34.0)
MCHC: 34 g/dL (ref 30.0–36.0)
MCV: 83 fL (ref 78.0–100.0)
MONO ABS: 1 10*3/uL (ref 0.1–1.0)
Monocytes Relative: 5 % (ref 3–12)
NEUTROS ABS: 16.4 10*3/uL — AB (ref 1.7–7.7)
NEUTROS PCT: 90 % — AB (ref 43–77)
Platelets: 327 10*3/uL (ref 150–400)
RBC: 3.47 MIL/uL — ABNORMAL LOW (ref 3.87–5.11)
RDW: 14.5 % (ref 11.5–15.5)
WBC: 18.4 10*3/uL — ABNORMAL HIGH (ref 4.0–10.5)

## 2015-06-10 LAB — URINALYSIS, ROUTINE W REFLEX MICROSCOPIC
Bilirubin Urine: NEGATIVE
GLUCOSE, UA: NEGATIVE mg/dL
NITRITE: NEGATIVE
PH: 7 (ref 5.0–8.0)
PROTEIN: NEGATIVE mg/dL
Specific Gravity, Urine: 1.01 (ref 1.005–1.030)
Urobilinogen, UA: 0.2 mg/dL (ref 0.0–1.0)

## 2015-06-10 MED ORDER — METHYLERGONOVINE MALEATE 0.2 MG/ML IJ SOLN
INTRAMUSCULAR | Status: AC
  Administered 2015-06-10: 0.2 mg via INTRAMUSCULAR
  Filled 2015-06-10: qty 1

## 2015-06-10 MED ORDER — AZITHROMYCIN 250 MG PO TABS
1000.0000 mg | ORAL_TABLET | Freq: Once | ORAL | Status: AC
  Administered 2015-06-10: 1000 mg via ORAL
  Filled 2015-06-10: qty 4

## 2015-06-10 MED ORDER — CEFTRIAXONE SODIUM 250 MG IJ SOLR
250.0000 mg | Freq: Once | INTRAMUSCULAR | Status: AC
  Administered 2015-06-10: 250 mg via INTRAMUSCULAR
  Filled 2015-06-10: qty 250

## 2015-06-10 MED ORDER — FENTANYL CITRATE (PF) 100 MCG/2ML IJ SOLN
100.0000 ug | Freq: Once | INTRAMUSCULAR | Status: AC
  Administered 2015-06-10: 100 ug via INTRAVENOUS

## 2015-06-10 MED ORDER — METRONIDAZOLE 500 MG PO TABS: 500.0000 mg | ORAL_TABLET | Freq: Two times a day (BID) | ORAL | Status: DC

## 2015-06-10 MED ORDER — FENTANYL CITRATE (PF) 100 MCG/2ML IJ SOLN
INTRAMUSCULAR | Status: AC
  Administered 2015-06-10: 100 ug via INTRAVENOUS
  Filled 2015-06-10: qty 2

## 2015-06-10 MED ORDER — METHYLERGONOVINE MALEATE 0.2 MG/ML IJ SOLN
0.2000 mg | Freq: Once | INTRAMUSCULAR | Status: AC
  Administered 2015-06-10: 0.2 mg via INTRAMUSCULAR

## 2015-06-10 NOTE — MAU Provider Note (Signed)
Chief Complaint: Contractions   First Provider Initiated Contact with Patient 06/10/15 1914      SUBJECTIVE HPI: Joyce Rivers is a 29 y.o. [id number] at [redacted]w[redacted]d by LMP who presents to maternity admissions with abdominal pain and pelvic pressure.  Pt arrived with vaginal bleeding and reporting urge to push.  CNM called to room and pt quickly delivered nonviable infant.  Initial Apgar 0 with no heartbeat at delivery.  Cord clamped and cut.  Pt given opportunity to see infant.  Umbilical cord evulsed while attempting to deliver placenta. Bleeding minimal so attempted to delivery placenta manually.  Pt unable to tolerate vaginal exam r/t pain.  IV pain medication given and  Dr Harolyn Rutherford paged to room.  CNM able to grasp placenta and pt pushed to deliver intact placenta without need for additional intervention.  Bleeding minimal after delivery.    Abdominal Pain This is a new problem. The current episode started today. The problem occurs intermittently. The problem has been rapidly worsening. The pain is located in the generalized abdominal region. The pain is at a severity of 10/10. The pain is severe. The quality of the pain is cramping. The abdominal pain radiates to the back, pelvis and perineum. Associated symptoms include vomiting. Pertinent negatives include no constipation, diarrhea, dysuria, fever, frequency, headaches or nausea. She has tried nothing for the symptoms.    Past Medical History  Diagnosis Date  . Cyst, breast   . Pregnancy induced hypertension 2007  . Medical history non-contributory   . Infection     UTI  . Depression     pt denies 7/6  . Vaginal Pap smear, abnormal    Past Surgical History  Procedure Laterality Date  . Breast biopsy    . Wisdom tooth extraction     Social History   Social History  . Marital Status: Single    Spouse Name: N/A  . Number of Children: N/A  . Years of Education: N/A   Occupational History  . Not on file.   Social History Main  Topics  . Smoking status: Former Smoker    Quit date: 02/10/2015  . Smokeless tobacco: Never Used  . Alcohol Use: No     Comment: not while pregnant  . Drug Use: No  . Sexual Activity: Yes    Birth Control/ Protection: None   Other Topics Concern  . Not on file   Social History Narrative   No current facility-administered medications on file prior to encounter.   Current Outpatient Prescriptions on File Prior to Encounter  Medication Sig Dispense Refill  . cyclobenzaprine (FLEXERIL) 10 MG tablet Take 1 tablet (10 mg total) by mouth 3 (three) times daily as needed for muscle spasms. (Patient not taking: Reported on 05/27/2015) 20 tablet 0  . docusate sodium (COLACE) 250 MG capsule Take 1 capsule (250 mg total) by mouth daily. (Patient not taking: Reported on 05/27/2015) 10 capsule 0  . famotidine (PEPCID) 20 MG tablet Take 1 tablet (20 mg total) by mouth 2 (two) times daily. (Patient not taking: Reported on 05/27/2015) 30 tablet 3  . metoCLOPramide (REGLAN) 10 MG tablet Take 1 tablet (10 mg total) by mouth every 6 (six) hours. (Patient taking differently: Take 10 mg by mouth every 6 (six) hours as needed for nausea or vomiting. ) 90 tablet 1  . metroNIDAZOLE (FLAGYL) 500 MG tablet Take 1 tablet (500 mg total) by mouth 2 (two) times daily. (Patient not taking: Reported on 05/27/2015) 14 tablet 0  .  polyethylene glycol powder (GLYCOLAX/MIRALAX) powder 1 capful daily until normal bowel movements resume (Patient not taking: Reported on 05/27/2015) 255 g 0  . prenatal vitamin w/FE, FA (PRENATAL 1 + 1) 27-1 MG TABS tablet Take 1 tablet by mouth daily. 30 each 0  . promethazine (PHENERGAN) 25 MG tablet Take 1 tablet (25 mg total) by mouth every 6 (six) hours as needed for nausea. (Patient taking differently: Take 25 mg by mouth at bedtime as needed for nausea or vomiting. ) 30 tablet 1   No Known Allergies  ROS:  Review of Systems  Constitutional: Negative for fever, chills and fatigue.  HENT:  Negative for sinus pressure.   Eyes: Negative for photophobia.  Respiratory: Negative for shortness of breath.   Cardiovascular: Negative for chest pain.  Gastrointestinal: Positive for vomiting and abdominal pain. Negative for nausea, diarrhea and constipation.  Genitourinary: Positive for vaginal bleeding, vaginal discharge and pelvic pain. Negative for dysuria, frequency, flank pain and difficulty urinating.  Musculoskeletal: Negative for neck pain.  Neurological: Negative for dizziness, weakness and headaches.  Psychiatric/Behavioral: Negative.     I have reviewed patient's Past Medical Hx, Surgical Hx, Family Hx, Social Hx, medications and allergies.   Physical Exam   Patient Vitals for the past 24 hrs:  BP Temp Temp src Pulse Resp  06/10/15 2016 126/76 mmHg - - 98 -  06/10/15 2004 126/73 mmHg - - 104 18  06/10/15 1947 120/71 mmHg - - 105 20  06/10/15 1914 137/79 mmHg - - 102 -  06/10/15 1903 - 99.2 F (37.3 C) Oral - 18   Constitutional: Well-developed, well-nourished female in no acute distress.  Cardiovascular: normal rate Respiratory: normal effort GI: Abd soft, non-tender. Pos BS x 4 MS: Extremities nontender, no edema, normal ROM Neurologic: Alert and oriented x 4.  GU: Neg CVAT.  PELVIC EXAM: See delivery note above.    LAB RESULTS Results for orders placed or performed during the hospital encounter of 06/10/15 (from the past 24 hour(s))  CBC with Differential/Platelet     Status: Abnormal   Collection Time: 06/10/15  7:49 PM  Result Value Ref Range   WBC 18.4 (H) 4.0 - 10.5 K/uL   RBC 3.47 (L) 3.87 - 5.11 MIL/uL   Hemoglobin 9.8 (L) 12.0 - 15.0 g/dL   HCT 28.8 (L) 36.0 - 46.0 %   MCV 83.0 78.0 - 100.0 fL   MCH 28.2 26.0 - 34.0 pg   MCHC 34.0 30.0 - 36.0 g/dL   RDW 14.5 11.5 - 15.5 %   Platelets 327 150 - 400 K/uL   Neutrophils Relative % 90 (H) 43 - 77 %   Neutro Abs 16.4 (H) 1.7 - 7.7 K/uL   Lymphocytes Relative 5 (L) 12 - 46 %   Lymphs Abs 1.0 0.7 -  4.0 K/uL   Monocytes Relative 5 3 - 12 %   Monocytes Absolute 1.0 0.1 - 1.0 K/uL   Eosinophils Relative 0 0 - 5 %   Eosinophils Absolute 0.0 0.0 - 0.7 K/uL   Basophils Relative 0 0 - 1 %   Basophils Absolute 0.0 0.0 - 0.1 K/uL    A/POS/-- (08/16 1042)   MAU Management/MDM: Ordered labs and reviewed results.  Consulted Orell Hurtado and Dr Stanford Breed to room.  Treatments in MAU included Fentanyl 100 mcg IV push,  LR x 1000 ml, Rocephin 250 mg IM, azithromycin 1000 mg PO, and methergine 0.2 mg IM.    ASSESSMENT 1. SAB (spontaneous abortion) at [redacted]w[redacted]d  PLAN Discharge home with bleeding and infection precautions Flagyl 500 mg BID x 7 days given concern about infection F/U in clinic in 2 weeks Return to MAU as needed for emergencies Appropriate support given to patient and her family    Medication List    STOP taking these medications        cyclobenzaprine 10 MG tablet  Commonly known as:  FLEXERIL     docusate sodium 250 MG capsule  Commonly known as:  COLACE     famotidine 20 MG tablet  Commonly known as:  PEPCID     metoCLOPramide 10 MG tablet  Commonly known as:  REGLAN     polyethylene glycol powder powder  Commonly known as:  GLYCOLAX/MIRALAX     prenatal vitamin w/FE, FA 27-1 MG Tabs tablet     promethazine 25 MG tablet  Commonly known as:  PHENERGAN      TAKE these medications        metroNIDAZOLE 500 MG tablet  Commonly known as:  FLAGYL  Take 1 tablet (500 mg total) by mouth 2 (two) times daily.       Follow-up Information    Follow up with [facility].   Specialty:  Obstetrics and Gynecology   Why:  The clinic will call you with appointment in 2 weeks.   Contact information:   [place] [phone]      Follow up with [facility].   Why:  As needed for emergencies   Contact information:   [address] [phone] [place] Certified Nurse-Midwife 06/10/2015  8:25 PM  Attestation of Attending Supervision of Advanced Practitioner (PA/CNM/NP): Evaluation and management procedures were performed by the Advanced Practitioner under my supervision and collaboration.  I have reviewed the Advanced Practitioner's note and chart, and I agree with the management and plan.  Verita Schneiders, MD, [place] Attending [place], [facility]

## 2015-06-10 NOTE — MAU Note (Signed)
Patient arrived via EMS with C/O contractions and bleeding. Upon arrival, patient C/O pressure like she needed to push. Small amount blood noted on perineum. Patient C/O a lot of pain. Cathleen Corti, CNM called to room where nonviable fetus was delivered at [place] [facility]. Cord was clamped. Patient viewed fetus, but did not hold. Placenta not delivered.

## 2015-06-10 NOTE — MAU Note (Signed)
Pt given the fetus to hold wrapped in a blanket per patient request. Pt is tearful.

## 2015-06-10 NOTE — MAU Note (Signed)
Bleeding scant; underpad and linens changed, pericare done.

## 2015-06-10 NOTE — Discharge Instructions (Signed)
Miscarriage A miscarriage is the sudden loss of an unborn baby (fetus) before the 20th week of pregnancy. Most miscarriages happen in the first 3 months of pregnancy. Sometimes, it happens before a woman even knows she is pregnant. A miscarriage is also called a "spontaneous miscarriage" or "early pregnancy loss." Having a miscarriage can be an emotional experience. Talk with your caregiver about any questions you may have about miscarrying, the grieving process, and your future pregnancy plans. CAUSES   Problems with the fetal chromosomes that make it impossible for the baby to develop normally. Problems with the baby's genes or chromosomes are most often the result of errors that occur, by chance, as the embryo divides and grows. The problems are not inherited from the parents.  Infection of the cervix or uterus.   Hormone problems.   Problems with the cervix, such as having an incompetent cervix. This is when the tissue in the cervix is not strong enough to hold the pregnancy.   Problems with the uterus, such as an abnormally shaped uterus, uterine fibroids, or congenital abnormalities.   Certain medical conditions.   Smoking, drinking alcohol, or taking illegal drugs.   Trauma.  Often, the cause of a miscarriage is unknown.  SYMPTOMS   Vaginal bleeding or spotting, with or without cramps or pain.  Pain or cramping in the abdomen or lower back.  Passing fluid, tissue, or blood clots from the vagina. DIAGNOSIS  Your caregiver will perform a physical exam. You may also have an ultrasound to confirm the miscarriage. Blood or urine tests may also be ordered. TREATMENT   Sometimes, treatment is not necessary if you naturally pass all the fetal tissue that was in the uterus. If some of the fetus or placenta remains in the body (incomplete miscarriage), tissue left behind may become infected and must be removed. Usually, a dilation and curettage (D and C) procedure is performed.  During a D and C procedure, the cervix is widened (dilated) and any remaining fetal or placental tissue is gently removed from the uterus.  Antibiotic medicines are prescribed if there is an infection. Other medicines may be given to reduce the size of the uterus (contract) if there is a lot of bleeding.  If you have Rh negative blood and your baby was Rh positive, you will need a Rh immunoglobulin shot. This shot will protect any future baby from having Rh blood problems in future pregnancies. HOME CARE INSTRUCTIONS   Your caregiver may order bed rest or may allow you to continue light activity. Resume activity as directed by your caregiver.  Have someone help with home and family responsibilities during this time.   Keep track of the number of sanitary pads you use each day and how soaked (saturated) they are. Write down this information.   Do not use tampons. Do not douche or have sexual intercourse until approved by your caregiver.   Only take over-the-counter or prescription medicines for pain or discomfort as directed by your caregiver.   Do not take aspirin. Aspirin can cause bleeding.   Keep all follow-up appointments with your caregiver.   If you or your partner have problems with grieving, talk to your caregiver or seek counseling to help cope with the pregnancy loss. Allow enough time to grieve before trying to get pregnant again.  SEEK IMMEDIATE MEDICAL CARE IF:   You have severe cramps or pain in your back or abdomen.  You have a fever.  You pass large blood clots (walnut-sized   or larger) ortissue from your vagina. Save any tissue for your caregiver to inspect.   Your bleeding increases.   You have a thick, bad-smelling vaginal discharge.  You become lightheaded, weak, or you faint.   You have chills.  MAKE SURE YOU:  Understand these instructions.  Will watch your condition.  Will get help right away if you are not doing well or get  worse. Document Released: 03/23/2001 Document Revised: 01/22/2013 Document Reviewed: 11/16/2011 ExitCare Patient Information 2015 [employer]. This information is not intended to replace advice given to you by your health care provider. Make sure you discuss any questions you have with your health care provider.  

## 2015-06-10 NOTE — MAU Note (Signed)
Pictures taken and camera card placed in memory box. Footprints done and placed in memory box. Grief information and memory box given to patient. AC called to talk with patient about taking the fetus home and a home burial.  [facility] came to bs and discussed paper work and requirements.

## 2015-06-10 NOTE — MAU Note (Signed)
Bleeding scant.

## 2015-06-11 LAB — GC/CHLAMYDIA PROBE AMP (~~LOC~~) NOT AT ARMC
CHLAMYDIA, DNA PROBE: NEGATIVE
NEISSERIA GONORRHEA: NEGATIVE

## 2015-06-11 NOTE — Progress Notes (Signed)
GC/Chl re-ordered per cyto lab request.

## 2015-06-11 NOTE — MAU Note (Signed)
Patient D/C home with fetus per request for home burial.

## 2015-06-12 ENCOUNTER — Ambulatory Visit (HOSPITAL_COMMUNITY): Payer: Medicaid Other

## 2015-06-26 ENCOUNTER — Encounter: Payer: Medicaid Other | Admitting: Obstetrics & Gynecology

## 2015-06-27 ENCOUNTER — Encounter: Payer: Self-pay | Admitting: Family Medicine

## 2015-06-27 ENCOUNTER — Ambulatory Visit (INDEPENDENT_AMBULATORY_CARE_PROVIDER_SITE_OTHER): Payer: Medicaid Other | Admitting: Family Medicine

## 2015-06-27 DIAGNOSIS — Z3490 Encounter for supervision of normal pregnancy, unspecified, unspecified trimester: Secondary | ICD-10-CM | POA: Insufficient documentation

## 2015-06-27 DIAGNOSIS — D219 Benign neoplasm of connective and other soft tissue, unspecified: Secondary | ICD-10-CM | POA: Insufficient documentation

## 2015-06-27 DIAGNOSIS — D259 Leiomyoma of uterus, unspecified: Secondary | ICD-10-CM

## 2015-06-27 NOTE — Progress Notes (Signed)
Subjective:     Patient ID: Joyce Rivers, female   DOB: Dec 03, 1985, 29 y.o.   MRN: [id number]  HPI Here for follow up after IUFD in [place] on 06/10/2015. Reports she has been depressed and tearful since this happened. She and partner have resumed sexual activity. She desires another pregnancy but her family "thinks she might need help." She described her grief. She has questions about the impact of her fibroids on IUFD and if she can get pregnant again. She also would like to seek grief counseling.   Bleeding stopped but then had spotting starting 2 days ago.   Review of Systems  Constitutional: Negative for fever and chills.  HENT: Negative for congestion and sore throat.   Eyes: Negative for pain and visual disturbance.  Respiratory: Negative for cough, chest tightness and shortness of breath.   Cardiovascular: Negative for chest pain.  Gastrointestinal: Negative for nausea, vomiting, abdominal pain and diarrhea.  Endocrine: Negative for cold intolerance and heat intolerance.  Genitourinary: Negative for dysuria and flank pain.  Musculoskeletal: Negative for back pain.  Skin: Negative for rash.  Allergic/Immunologic: Negative for food allergies.  Neurological: Negative for dizziness and light-headedness.  Psychiatric/Behavioral: Negative for agitation.   I personally reviewed and updated appropriately patient's PMH, PSH, Family history, social history, medications and allergies.   BP 132/83 mmHg  Pulse 78  Temp(Src) 98.5 F (36.9 C)  Ht 5\' 8"  (1.727 m)  Wt 205 lb 11.2 oz (93.305 kg)  BMI 31.28 kg/m2    Objective:   Physical Exam  Constitutional: She is oriented to person, place, and time. She appears well-developed and well-nourished.  HENT:  Head: Normocephalic and atraumatic.  Eyes: Conjunctivae are normal.  Neck: Normal range of motion. Neck supple.  Cardiovascular: Normal rate and intact distal pulses.   Pulmonary/Chest: Effort normal.  Abdominal: Soft.   Musculoskeletal: Normal range of motion.  Neurological: She is alert and oriented to person, place, and time.  Skin: Skin is warm and dry. No erythema.  Psychiatric: She has a normal mood and affect.  Good eye contact. Somewhat withdrawn but appropriate. Not labile.   Nursing note and vitals reviewed.     Assessment:     Postpartum care after 18w IUFD   Plan:     -Discussed resuming sexual activity and pregnancy timing -Recommended continuing PNV if planning pregnancy -Reviewed return of menses -Provided number for [facility] -Provided support for patient regarding miscarriage/IUFD.   -briefly discussed unlikely contributory nature of fibroids but offered meeting with colleague to discuss myomectomy but discussed that the patient would necessitate CS with future pregnancy if she had this procedure. She does not want surgery at this time and was overall reassured regarding fibroids.

## 2015-06-27 NOTE — Patient Instructions (Signed)
Intrauterine Fetal Demise °About one percent of normal, uncomplicated pregnancies end in fetal death (intrauterine fetal demise, IUFD). It is considered a fetal death when it occurs after the 20th week of pregnancy. It is considered a miscarriage when a fetal death occurs in the first 20 weeks. The mother's health is usually not in danger. Usually, there is nothing that can be done to prevent it. °CAUSES °· Often the cause is unknown. °· Examination of the stillborn fetus after delivery may show an abnormality in the umbilical cord. An exam my also show a problem with the placenta or fetus. These problems may include infections or a variety of birth defects and genetic disorders. °· The pregnancy continues for 42 weeks or later (post term pregnancy). °· Conditions in the mother such as diabetes, high blood pressure, and numerous other medical, physical or poor lifestyle choices (illegal drugs, alcohol, smoking) increase the risk for fetal death. Often, however, risk factors are unknown. °· Multiple pregnancies (twins or more) increase the risk of fetal death. °SYMPTOMS  °· The mother may not notice symptoms in the early stages of pregnancy. Learning what is wrong (diagnosis) is based on: °¨ The loss of baby's heart sounds. °¨ The lack of increasing belly (abdominal) growth. °¨ Ultrasound studies which suggest death of the fetus. °· In later stages of pregnancy, a woman may be aware of changes in the fetal movement (kicks), or that the movement has stopped. °RELATED COMPLICATIONS °· Disseminated intravascular coagulation (DIC) is a problem with blood clotting. This can result in severe bleeding and rarely develops late after fetal death. °· Infection of the products of the pregnancy (fetal materials). °· Increase bleeding from retained fetal parts or placenta. °TREATMENT  °· Treatment should be accomplished within 2 weeks of the discovered fetal death. °· To confirm the fetal death, diagnostic tests are done such  as: °¨ X-rays. °¨ Ultrasound. °¨ Amniotic fluid studies (looking at the fluid in the sac surrounding the baby). °· Most women, on learning that their fetus is dead, prefer early removal of the contents of the womb (uterus). In the first three months of pregnancy (first trimester), this is usually done by D and C or with suction curettage. Suction curettage is a technique used to remove the dead fetus and other tissue of the pregnancy from the uterus. It uses an instrument somewhat like a straw, connected by tubing to a machine, that your caregiver uses to suck out the dead contents of the uterus. NOTE: Suction curettage may be done in the second and third trimester after delivery of the dead fetus only to make sure there is no placental tissue left in the uterus but not to suction out the fetus. °· In the second trimester, treatment is more frequently accomplished with high doses of a drug, prostaglandin E (Prostin) suppositories or in combination with laminaria (as specialized seaweed product that absorbs moisture and expands to gradually stretch and open the cervix). Prostin (T) causes labor to start. °· In the third trimester, it may be accomplished with laminaria and misoprostol vaginal suppositories to induce labor. It may also be done with the drugs intravenous oxytocin plus prostaglandin E. °· If there was an infection involved with the fetal death, you will be given an antibiotic. You will be given Rho-gam if you are Rh negative and the baby is Rh positive (a vaccine to prevent Rh problems with a future pregnancy). An additional treatment option is to wait for spontaneous labor, which usually occurs within 2   weeks, but may be longer. This is called expectant therapy. °· Following removal of the products of the pregnancy, the stillborn fetus is usually examined by a specialist (pathologist) to determine if problems are present that may reoccur in another pregnancy. This can help plan future pregnancies. That  planning will also include treatment which will best guarantee a good outcome in future pregnancies. °· Your caregiver can also help you deal with feelings of loss, guilt, loneliness, anxiety, and hostility. Family and friends can be helpful. If severe grief lasts longer than several months, professional counseling may be helpful. Joining a grief support group may be useful. °· Any medicines prescribed will depend on the type of treatment received. °Other problems can be cared for with your caregivers. There may be discussions on whether or not to see, touch or photograph the infant, whether to name the infant, what to do with the remains (burial or cremation), and holding religious services. °HOME CARE INSTRUCTIONS  °· Restrictions are usually not necessary unless associated with the delivery choice. °· Sexual intercourse should be avoided for 4 to 6 weeks. Starting another pregnancy should be delayed several months, or as suggested by your caregiver. °· Do not use tampons or douche. °· Only take over-the-counter or prescription medicines for pain, discomfort, or fever as directed by your caregiver. Do not take aspirin it can cause you to bleed. Call your caregiver for a prescription for stronger pain medication if you need it. °· No special diet is necessary unless you have diabetes or other medical problems that require a special diet. °· Take showers instead of baths until your caregiver tells you it is okay. °· Ask your caregiver when you can return to driving and to your everyday activities. °· Make an appointment with your caregiver for follow up care. °PREVENTION  °· Eliminate any of the causes, if possible, that were found after evaluating the fetus. °· Control any medical problems you may have before or during the pregnancy. °· Avoid illegal drugs, alcohol and smoking. °· Maintain good prenatal care and follow your caregiver's treatment and advice. °· Report any concerns or unusual changes you notice  during your pregnancy. °· More frequent prenatal visits may be necessary with the next child. °SEEK MEDICAL CARE IF:  °· You develop abnormal vaginal discharge. °· You develop a temperature 102° F (38.9° C) or higher. °· You are getting dizzy and faint. °· You are feeling depressed. °SEEK IMMEDIATE MEDICAL CARE IF:  °During pregnancy: °· You fail to gain weight, or your abdomen is not increasing in size. °· Your unborn child appears to have less movement or stopped moving. Keep your medical conditions under control. °After delivery: °· You have heavy vaginal bleeding. °· You have chills and fever. °· You have chest pain. °· You have shortness of breath. °· You have pain or swelling or redness of your leg. °· Following the death of a fetus, you or a family member need help or emotional support in coping with the grief process. °MAKE SURE YOU:  °· Understand these instructions. °· Will watch your condition. °· Will get help right away if you are not doing well or get worse. °Document Released: 09/27/2005 Document Revised: 12/20/2011 Document Reviewed: 06/22/2007 °ExitCare® Patient Information ©2015 [employer]. This information is not intended to replace advice given to you by your health care provider. Make sure you discuss any questions you have with your health care provider. ° °

## 2015-08-13 ENCOUNTER — Encounter: Payer: Self-pay | Admitting: *Deleted

## 2016-04-20 IMAGING — US US OB COMP LESS 14 WK
1 series · 14 of 28 positions shown · non-contrast
Comparison: None.

CLINICAL DATA: Hyperemesis

EXAM:
OBSTETRIC <14 WK ULTRASOUND
TECHNIQUE: Transabdominal ultrasound was performed for evaluation of the
gestation as well as the maternal uterus and adnexal regions.

[Series 1: us ob comp less 14 wk · 49 acquisitions, 14 frames shown]
[im 2/49]
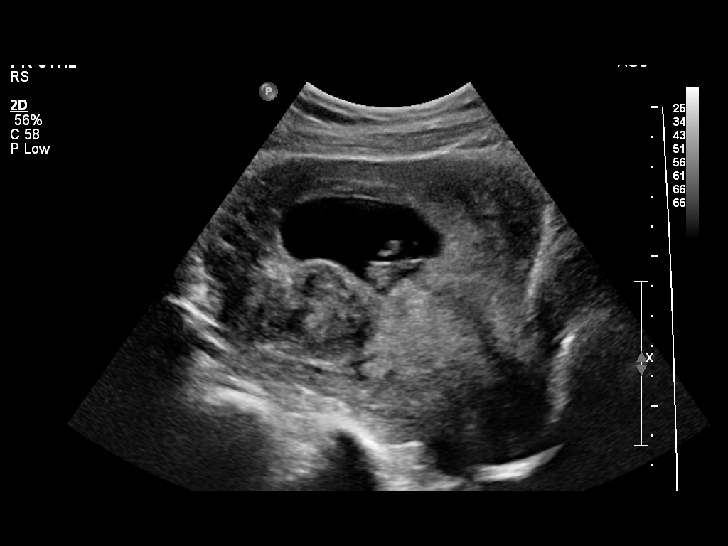
[im 6/49]
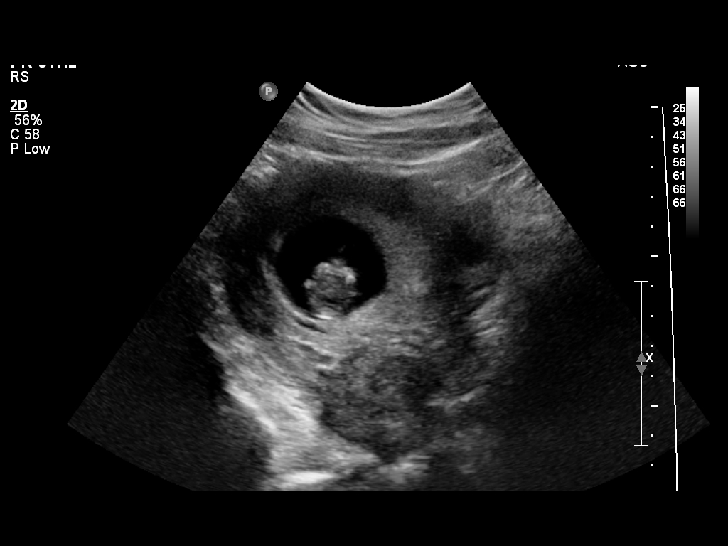
[im 9/49]
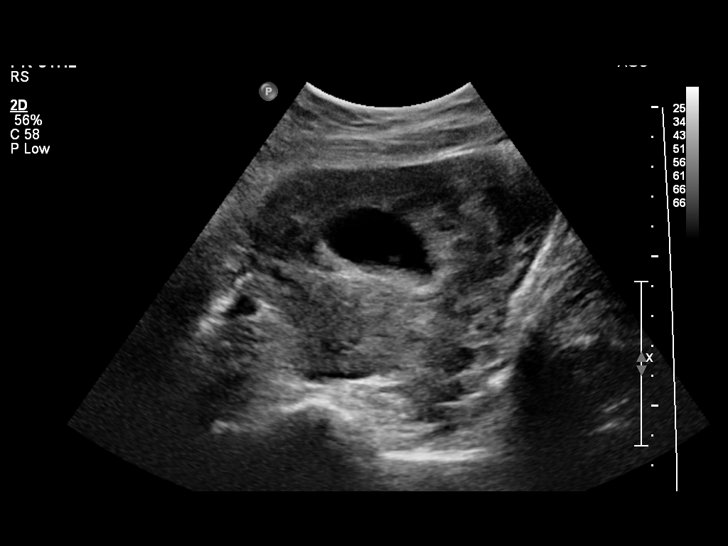
[im 13/49]
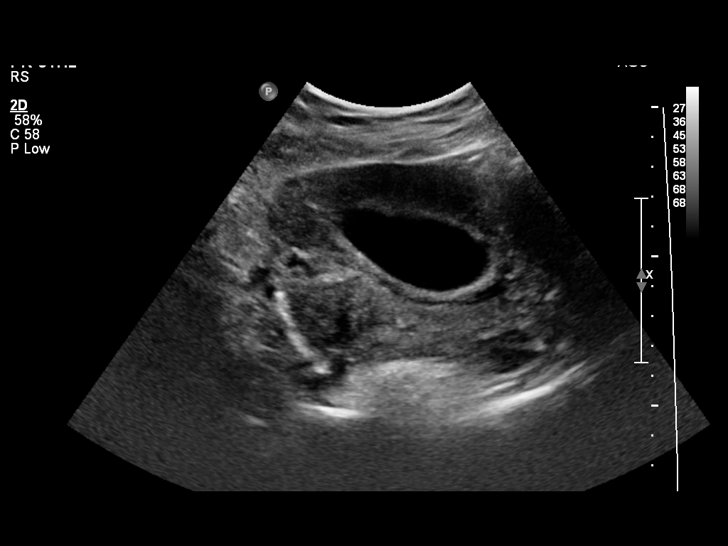
[im 17/49]
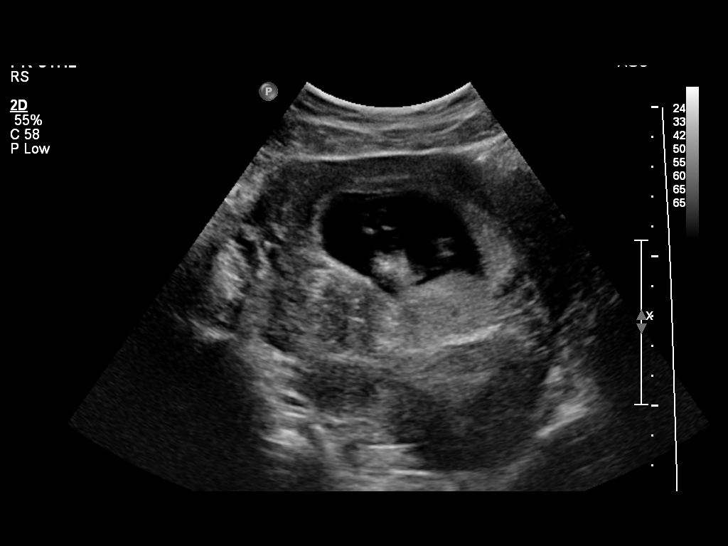
[im 20/49]
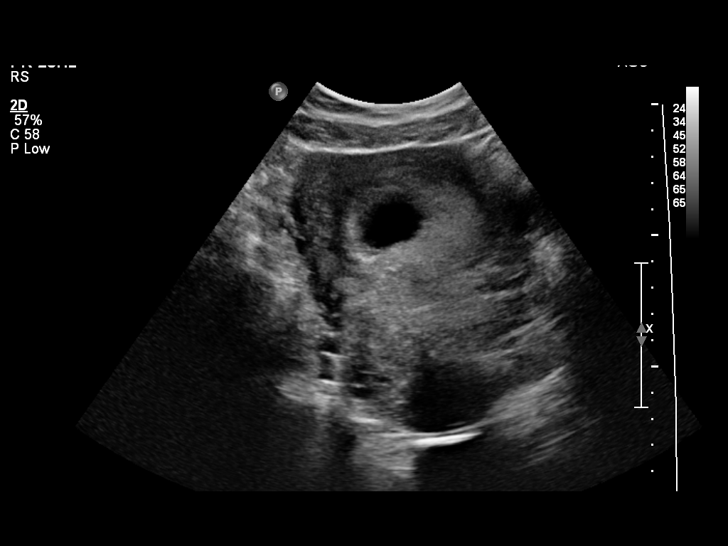
[im 24/49]
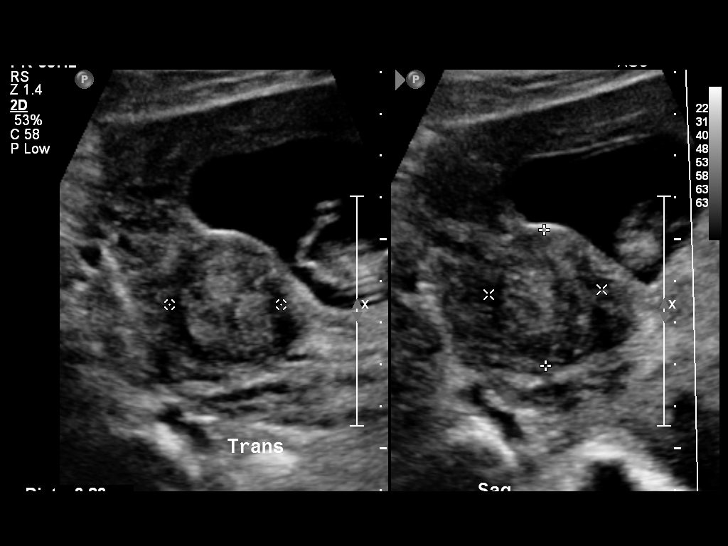
[im 27/49]
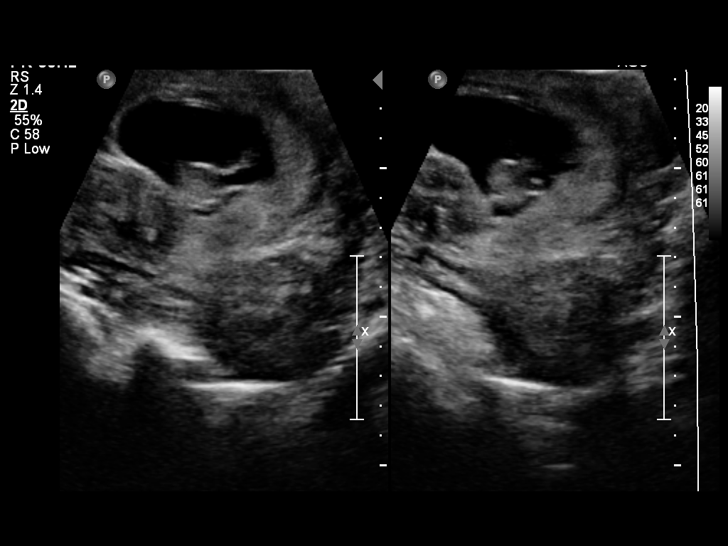
[im 31/49]
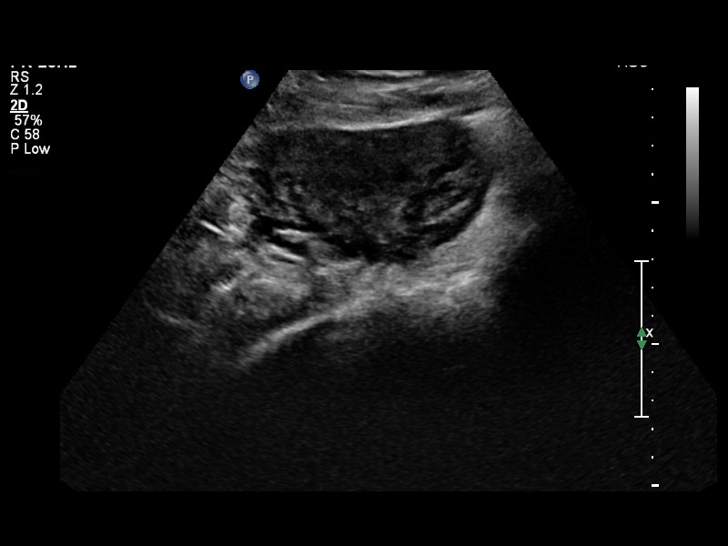
[im 34/49]
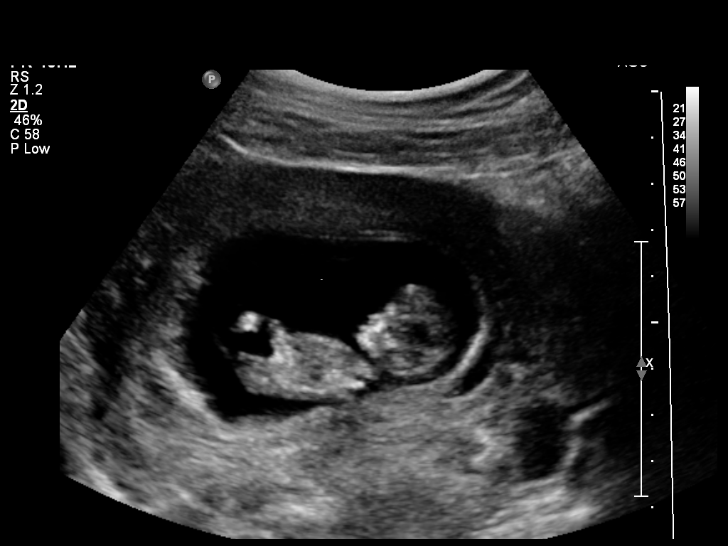
[im 38/49]
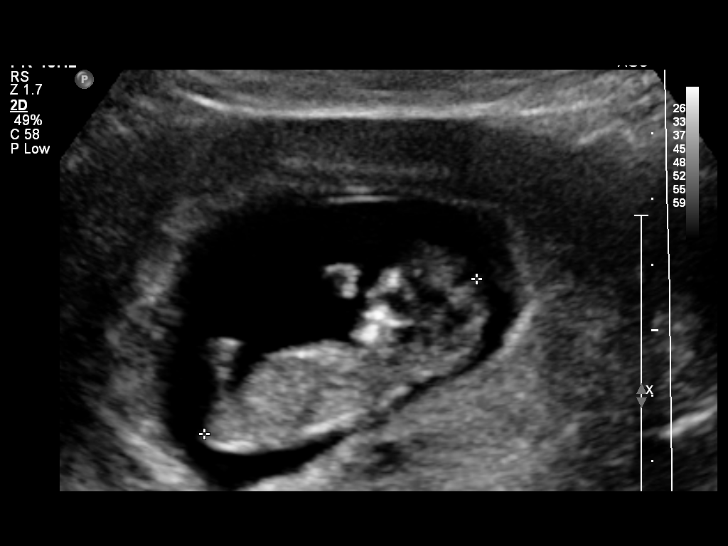
[im 41/49]
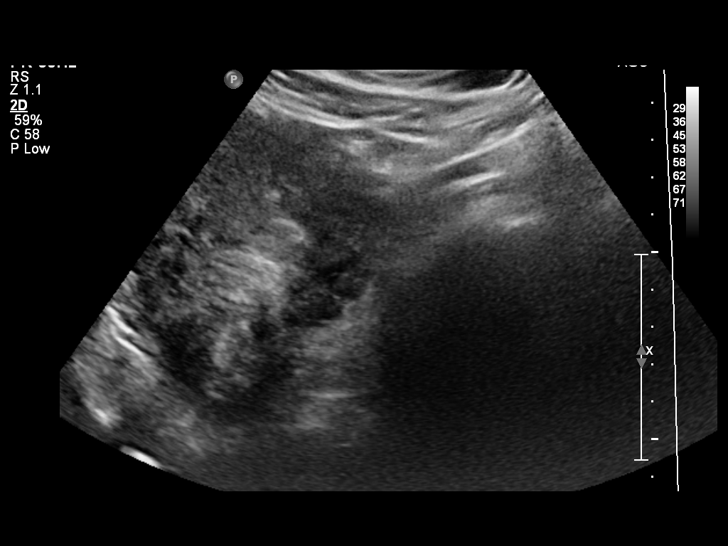
[im 45/49]
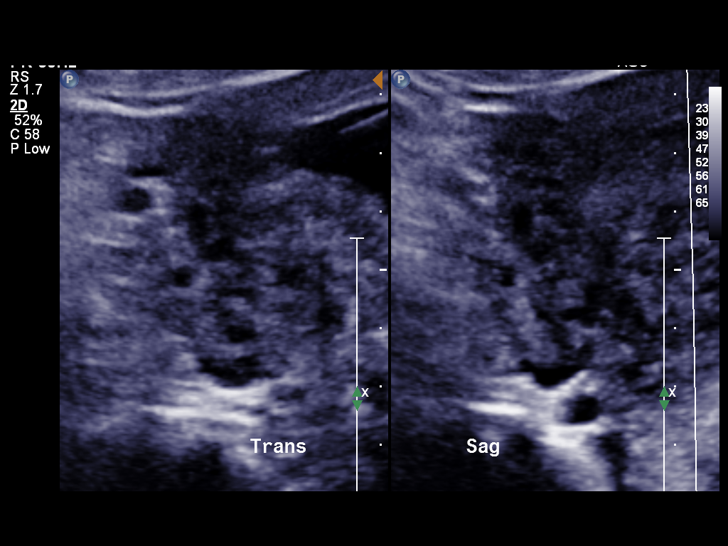
[im 49/49]
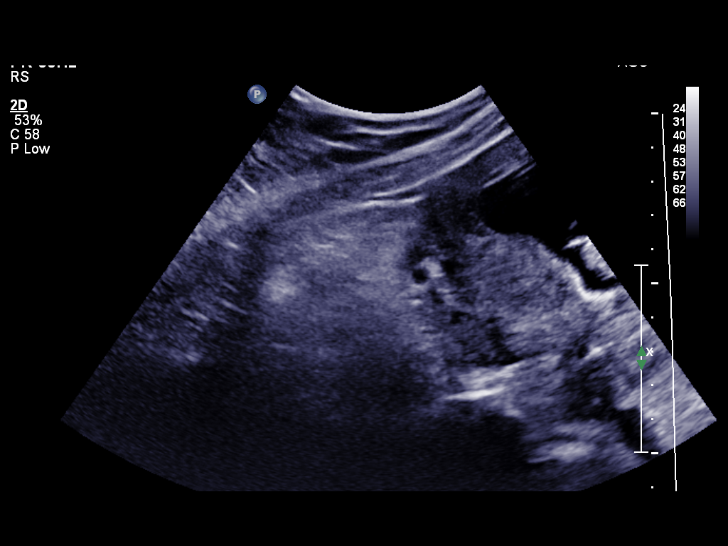

[14 of 28 positions shown; findings below may reference images not displayed]

FINDINGS: Intrauterine gestational sac: Single

Yolk sac:  Yes

Embryo:  Yes

Cardiac Activity: Yes

Heart Rate: 158 bpm

CRL:   4.72 cm  mm   11 w 4d                  US EDC: 10/31/2015

Maternal uterus/adnexae:

Subchorionic hemorrhage: Small hemorrhage noted measuring 4.9 x
x 4.1 cm

Right ovary: Normal

Left ovary: Normal

Other :Several fibroids noted. The largest measures 4.2 x 4.5 x
cm.

Free fluid:  Trace free fluid noted.
IMPRESSION: 1. Single living intrauterine gestation. The estimated gestational
age is 11 weeks and 4 days.
2. Small subchorionic hemorrhage.
3. Uterine fibroids.

## 2018-05-05 ENCOUNTER — Ambulatory Visit (HOSPITAL_BASED_OUTPATIENT_CLINIC_OR_DEPARTMENT_OTHER)
Admission: RE | Admit: 2018-05-05 | Discharge: 2018-05-05 | Disposition: A | Discharge status: Home / Self Care | Payer: No Typology Code available for payment source | Source: Ambulatory Visit | Attending: Obstetrics and Gynecology | Admitting: Obstetrics and Gynecology

## 2018-05-05 ENCOUNTER — Ambulatory Visit (INDEPENDENT_AMBULATORY_CARE_PROVIDER_SITE_OTHER): Payer: No Typology Code available for payment source | Admitting: Obstetrics and Gynecology

## 2018-05-05 ENCOUNTER — Encounter: Payer: Self-pay | Admitting: Obstetrics and Gynecology

## 2018-05-05 VITALS — BP 115/73 | HR 80 | Ht 69.0 in | Wt 222.0 lb

## 2018-05-05 DIAGNOSIS — Z113 Encounter for screening for infections with a predominantly sexual mode of transmission: Secondary | ICD-10-CM

## 2018-05-05 DIAGNOSIS — Z124 Encounter for screening for malignant neoplasm of cervix: Secondary | ICD-10-CM | POA: Diagnosis not present

## 2018-05-05 DIAGNOSIS — Z1151 Encounter for screening for human papillomavirus (HPV): Secondary | ICD-10-CM

## 2018-05-05 DIAGNOSIS — Z01419 Encounter for gynecological examination (general) (routine) without abnormal findings: Secondary | ICD-10-CM

## 2018-05-05 DIAGNOSIS — D259 Leiomyoma of uterus, unspecified: Secondary | ICD-10-CM | POA: Insufficient documentation

## 2018-05-05 DIAGNOSIS — R102 Pelvic and perineal pain: Secondary | ICD-10-CM

## 2018-05-05 DIAGNOSIS — N644 Mastodynia: Secondary | ICD-10-CM

## 2018-05-05 NOTE — Progress Notes (Signed)
GYNECOLOGY ANNUAL PREVENTATIVE CARE ENCOUNTER NOTE  Subjective:   Joyce Rivers is a 32 y.o. [phone] female here for a annual gynecologic exam. Current complaints: breasts are getting bigger and heavier, some pain. Also left sided abdominal pain for about a month, aching in nature, waxing and waning, at worst about a 5/10, not related to defecation, urination, eating, menses. Nothing makes it better or worse. Periods are monthly, off by about 2-3 days here and there, bleeds 7 days, and reports it is heavy.  Denies abnormal vaginal bleeding, discharge, pelvic pain, problems with intercourse or other gynecologic concerns.   Not on contraception and declines contraception.   Gynecologic History Patient's last menstrual period was 04/15/2018. Contraception: none Last Pap: 2016. Results were: normal Last mammogram: n/a  Obstetric History OB History  Gravida Para Term Preterm AB Living  4 2 1   2 1   SAB TAB Ectopic Multiple Live Births  2     0 1    # Outcome Date GA Lbr Len/2nd Weight Sex Delivery Anes PTL Lv  4 Para 06/10/15 [redacted]w[redacted]d 03:50 / 00:03 7.4 oz (0.21 kg) M Vag-Spont None  FD  3 SAB 2013 [redacted]w[redacted]d         2 SAB 2011 [redacted]w[redacted]d         1 Term 07/20/06 [redacted]w[redacted]d  7 lb (3.175 kg) F Vag-Spont EPI N LIV    Past Medical History:  Diagnosis Date  . Cyst, breast   . Depression    pt denies 7/6  . Infection    UTI  . Medical history non-contributory   . Pregnancy induced hypertension 2007  . Vaginal Pap smear, abnormal     Past Surgical History:  Procedure Laterality Date  . BREAST BIOPSY    . WISDOM TOOTH EXTRACTION      Current Outpatient Medications on File Prior to Visit  Medication Sig Dispense Refill  . busPIRone (BUSPAR) 10 MG tablet Take by mouth.     No current facility-administered medications on file prior to visit.     No Known Allergies  Social History   Socioeconomic History  . Marital status: Single    Spouse name: Not on file  . Number of children: Not  on file  . Years of education: Not on file  . Highest education level: Not on file  Occupational History  . Not on file  Social Needs  . Financial resource strain: Not on file  . Food insecurity:    Worry: Not on file    Inability: Not on file  . Transportation needs:    Medical: Not on file    Non-medical: Not on file  Tobacco Use  . Smoking status: Former Smoker    Last attempt to quit: 02/10/2015    Years since quitting: 3.2  . Smokeless tobacco: Never Used  Substance and Sexual Activity  . Alcohol use: No    Comment: not while pregnant  . Drug use: No  . Sexual activity: Yes    Birth control/protection: None  Lifestyle  . Physical activity:    Days per week: Not on file    Minutes per session: Not on file  . Stress: Not on file  Relationships  . Social connections:    Talks on phone: Not on file    Gets together: Not on file    Attends religious service: Not on file    Active member of club or organization: Not on file    Attends meetings of  clubs or organizations: Not on file    Relationship status: Not on file  . Intimate partner violence:    Fear of current or ex partner: Not on file    Emotionally abused: Not on file    Physically abused: Not on file    Forced sexual activity: Not on file  Other Topics Concern  . Not on file  Social History Narrative  . Not on file    Family History  Problem Relation Age of Onset  . Cancer Paternal Grandmother        breast  . Heart disease Paternal Grandmother   . Asthma Sister   . Asthma Son   . Diabetes Neg Hx   . Hypertension Neg Hx     The following portions of the patient's history were reviewed and updated as appropriate: allergies, current medications, past family history, past medical history, past social history, past surgical history and problem list.  Review of Systems Pertinent items are noted in HPI.   Objective:  BP 115/73   Pulse 80   Ht 5\' 9"  (1.753 m)   Wt 222 lb 0.6 oz (100.7 kg)   LMP  04/15/2018   BMI 32.79 kg/m  CONSTITUTIONAL: Well-developed, well-nourished female in no acute distress.  HENT:  Normocephalic, atraumatic, External right and left ear normal. Oropharynx is clear and moist EYES: Conjunctivae and EOM are normal. Pupils are equal, round, and reactive to light. No scleral icterus.  NECK: Normal range of motion, supple, no masses.  Normal thyroid.  SKIN: Skin is warm and dry. No rash noted. Not diaphoretic. No erythema. No pallor. NEUROLOGIC: Alert and oriented to person, place, and time. Normal reflexes, muscle tone coordination. No cranial nerve deficit noted. PSYCHIATRIC: Normal mood and affect. Normal behavior. Normal judgment and thought content. CARDIOVASCULAR: Normal heart rate noted, regular rhythm RESPIRATORY: Clear to auscultation bilaterally. Effort and breath sounds normal, no problems with respiration noted. BREASTS: Symmetric in size. No masses, skin changes, nipple drainage, or lymphadenopathy. ABDOMEN: Soft, normal bowel sounds, no distention noted.  No tenderness, rebound or guarding.  PELVIC: Normal appearing external genitalia; normal appearing vaginal mucosa and cervix.  No abnormal discharge noted.  Pap smear obtained.  Normal uterine size, no other palpable masses, no uterine or adnexal tenderness. MUSCULOSKELETAL: Normal range of motion. No tenderness.  No cyanosis, clubbing, or edema.  2+ distal pulses.   Assessment and Plan:   1. Encounter for well woman exam with routine gynecological exam - Cytology - PAP  2. Breast pain Patient with large, heavy breasts, with intermittent bilateral pain She is interested in a breast reduction - Ambulatory referral to Breast Clinic  3. Pelvic pain Palpable on abdomen, benign pelvic exam Reviewed could be due to some intestinal irritation, however as it has been present for about a month, will obtain US to rule out GYN etiology - US PELVIC COMPLETE WITH TRANSVAGINAL; Future  4. Routine screening  for STI (sexually transmitted infection) Agreeable to screen No known contacts GC/CT/Trich sent on pap - HIV antibody - Hepatitis B surface antigen - Hepatitis C antibody - RPR  Will follow up results of pap smear/STI screen and manage accordingly. Encouraged improvement in diet and exercise.    Routine preventative health maintenance measures emphasized. Please refer to After Visit Summary for other counseling recommendations.     Feliz Beam, M.D. [facility]

## 2018-05-06 LAB — RPR: RPR: NONREACTIVE

## 2018-05-06 LAB — HEPATITIS C ANTIBODY

## 2018-05-06 LAB — HEPATITIS B SURFACE ANTIGEN: HEP B S AG: NEGATIVE

## 2018-05-06 LAB — HIV ANTIBODY (ROUTINE TESTING W REFLEX): HIV SCREEN 4TH GENERATION: NONREACTIVE

## 2018-05-09 LAB — CYTOLOGY - PAP
BACTERIAL VAGINITIS: POSITIVE — AB
CANDIDA VAGINITIS: NEGATIVE
CHLAMYDIA, DNA PROBE: NEGATIVE
HPV: DETECTED — AB
Neisseria Gonorrhea: NEGATIVE
TRICH (WINDOWPATH): NEGATIVE

## 2018-05-15 ENCOUNTER — Telehealth: Payer: Self-pay

## 2018-05-15 DIAGNOSIS — N760 Acute vaginitis: Principal | ICD-10-CM

## 2018-05-15 DIAGNOSIS — B9689 Other specified bacterial agents as the cause of diseases classified elsewhere: Secondary | ICD-10-CM

## 2018-05-15 MED ORDER — METRONIDAZOLE 500 MG PO TABS: 500.0000 mg | ORAL_TABLET | Freq: Two times a day (BID) | ORAL | 0 refills | Status: DC

## 2018-05-15 NOTE — Telephone Encounter (Signed)
Pt tested positive for BV. Medication was sent to the pharmacy.

## 2018-05-15 NOTE — Telephone Encounter (Signed)
-----   Message from Sloan Leiter, MD sent at 05/15/2018  1:53 PM EDT ----- Please schedule patient for colposcopy.

## 2018-05-15 NOTE — Telephone Encounter (Signed)
-----   Message from Sloan Leiter, MD sent at 05/15/2018  1:54 PM EDT ----- Please let patient know her ultrasound had 2 fibroids on it, slightly larger than on ultrasound from 2016 but otherwise was unremarkable.

## 2018-05-15 NOTE — Telephone Encounter (Signed)
Called pt with results of abnormal pap smear and the need for a colposcopy. Pt is scheduled for a colposcopy on 8/22.Understanding was voiced.

## 2018-05-15 NOTE — Telephone Encounter (Signed)
Called pt about [place] results. Understanding was voiced.

## 2018-05-22 ENCOUNTER — Telehealth: Payer: Self-pay

## 2018-05-22 NOTE — Telephone Encounter (Signed)
Pt called because she had questions about her [place] and abnormal pap smear. 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 Also explained to pt that after colposcopy results comes back the doctor will discuss results with her. Explained to pt that her US showed showed some fibroids and everything else is normal. Understanding was voiced.

## 2018-05-25 ENCOUNTER — Ambulatory Visit: Payer: No Typology Code available for payment source | Admitting: Medical

## 2018-05-25 ENCOUNTER — Encounter: Payer: Self-pay | Admitting: Medical

## 2018-05-25 VITALS — BP 116/83 | HR 87 | Temp 98.1°F | Resp 16 | Ht 69.0 in | Wt 223.4 lb

## 2018-05-25 DIAGNOSIS — B977 Papillomavirus as the cause of diseases classified elsewhere: Secondary | ICD-10-CM | POA: Diagnosis not present

## 2018-05-25 DIAGNOSIS — N644 Mastodynia: Secondary | ICD-10-CM

## 2018-05-25 DIAGNOSIS — M546 Pain in thoracic spine: Secondary | ICD-10-CM | POA: Diagnosis not present

## 2018-05-25 DIAGNOSIS — F329 Major depressive disorder, single episode, unspecified: Secondary | ICD-10-CM | POA: Diagnosis not present

## 2018-05-25 DIAGNOSIS — G8929 Other chronic pain: Secondary | ICD-10-CM

## 2018-05-25 DIAGNOSIS — F32A Depression, unspecified: Secondary | ICD-10-CM

## 2018-05-25 MED ORDER — DICLOFENAC SODIUM 75 MG PO TBEC: 75.0000 mg | DELAYED_RELEASE_TABLET | Freq: Two times a day (BID) | ORAL | 0 refills | Status: AC

## 2018-05-25 NOTE — Patient Instructions (Addendum)
For hx of depression, personality disorder and anxiety, continue current meds and follow up with specialist. If severe mood changes or worsening then [facility] evaluation.   For hpv follow up with gyn.  For breast pain and upper back/trapezius pain recommend diclofenac nsaid. Also try to eliminate caffeine. Pain may be from fibrocystic changes during the month. May need to refer you to surgeon as you think you may need breast reduction. That is possible and will see how you respond to the above.  Please get opinion from gyn regarding if you need mammogram as they did breast exam 3 weeks ago and you have history of biopsies. Recommend that you get old records of imaging and biopsy for future use/imaging centers.  Follow up in 3-4 weeks or as needed

## 2018-05-25 NOTE — Progress Notes (Signed)
Subjective:    Patient ID: Joyce Rivers, female    DOB: 22-Oct-1985, 32 y.o.   MRN: [id number]  HPI  Pt is here for first time.   Pt has seen ED in the past.   Pt is unemployed presently, She does not exercise much. She walks her german shepherd. Pt states tries to eat healthy. Drinks one soda a day. Drinks green tea unsweeteds. Pt eats healthy per her report. Single. One child 72 yo.  She had on event lip swelling in past. No wheezing, no sob or anaphylaxis. No use ace inhibitors in past.   Pt does have history of history of depression, borderline personality and anxiety. Pt sees pshychiatrist and counselor at [facility]. Pt is on buspar twice a day. She does report recently mood varies from from ok to bad. Last time saw specialist 2 weeks ago. Pt went to group meeting yesterday. Pt can be seen by specialist quickly if needed.  Today her mood is good.  LMP- one week ago.  Pt had breast bx rt side in 32 yo. And then bx left side in 2015. Both sides were negative. She had CPE 3 weeks ago.(no family history of breast cancer)  History of HPV recently and she is scheduled for colposcopy. Pt also had some intermittent bilateral breast pain in both breast. She states she may need breast reduction. Pt states her upper back hurts as well.    Review of Systems  Constitutional: Negative for chills and fatigue.  HENT: Negative for congestion.   Respiratory: Negative for cough, chest tightness, shortness of breath and wheezing.   Cardiovascular: Negative for chest pain and palpitations.  Gastrointestinal: Negative for abdominal distention, blood in stool and constipation.  Genitourinary: Negative for difficulty urinating, flank pain, frequency and urgency.  Musculoskeletal: Positive for back pain.       Trapezius pain.  Skin: Negative for rash.  Neurological: Negative for dizziness, syncope, weakness, numbness and headaches.  Hematological: Negative for adenopathy.    Psychiatric/Behavioral: Positive for dysphoric mood. Negative for behavioral problems, decreased concentration and suicidal ideas. The patient is nervous/anxious.     Past Medical History:  Diagnosis Date  . Cyst, breast   . Depression    pt denies 7/6  . Infection    UTI  . Medical history non-contributory   . Pregnancy induced hypertension 2007  . Vaginal Pap smear, abnormal      Social History   Socioeconomic History  . Marital status: Single    Spouse name: Not on file  . Number of children: Not on file  . Years of education: Not on file  . Highest education level: Not on file  Occupational History  . Not on file  Social Needs  . Financial resource strain: Not on file  . Food insecurity:    Worry: Not on file    Inability: Not on file  . Transportation needs:    Medical: Not on file    Non-medical: Not on file  Tobacco Use  . Smoking status: Former Smoker    Last attempt to quit: 02/10/2015    Years since quitting: 3.2  . Smokeless tobacco: Never Used  Substance and Sexual Activity  . Alcohol use: No    Comment: not while pregnant  . Drug use: No  . Sexual activity: Yes    Birth control/protection: None  Lifestyle  . Physical activity:    Days per week: Not on file    Minutes per session: Not on  file  . Stress: Not on file  Relationships  . Social connections:    Talks on phone: Not on file    Gets together: Not on file    Attends religious service: Not on file    Active member of club or organization: Not on file    Attends meetings of clubs or organizations: Not on file    Relationship status: Not on file  . Intimate partner violence:    Fear of current or ex partner: Not on file    Emotionally abused: Not on file    Physically abused: Not on file    Forced sexual activity: Not on file  Other Topics Concern  . Not on file  Social History Narrative  . Not on file    Past Surgical History:  Procedure Laterality Date  . BREAST BIOPSY    .  WISDOM TOOTH EXTRACTION      Family History  Problem Relation Age of Onset  . Cancer Paternal Grandmother        breast  . Heart disease Paternal Grandmother   . Asthma Sister   . Asthma Son   . Diabetes Neg Hx   . Hypertension Neg Hx     No Known Allergies  Current Outpatient Medications on File Prior to Visit  Medication Sig Dispense Refill  . busPIRone (BUSPAR) 10 MG tablet Take by mouth.     No current facility-administered medications on file prior to visit.     BP 116/83   Pulse 87   Temp 98.1 F (36.7 C) (Oral)   Resp 16   Ht 5\' 9"  (1.753 m)   Wt 223 lb 6.4 oz (101.3 kg)   LMP 05/16/2018   SpO2 99%   BMI 32.99 kg/m       Objective:   Physical Exam  General Mental Status- Alert. General Appearance- Not in acute distress.   Neck- mild trapezius tender bilateral. No mid cspine pain.  Chest and Lung Exam Auscultation: Breath Sounds:-Normal.  Cardiovascular Auscultation:Rythm- Regular. Murmurs & Other Heart Sounds:Auscultation of the heart reveals- No Murmurs.  Abdomen Inspection:-Inspeection Normal. Palpation/Percussion:Note:No mass. Palpation and Percussion of the abdomen reveal- Non Tender, Non Distended + BS, no rebound or guarding.   Neurologic Cranial Nerve exam:- CN III-XII intact(No nystagmus), symmetric smile. Strength:- 5/5 equal and symmetric strength both upper and lower extremities.  Back- no mid tspine pain. Mild parathoracic tender on palpation.       Assessment & Plan:  For hx of depression, personality disorder and anxiety, continue current meds and follow up with specialist. If severe mood changes or worsening then [facility] evaluation.   For hpv follow up with gyn.  For breast pain and upper back/trapezius pain recommend diclofenac nsaid. Also try to eliminate caffeine. Pain may be from fibrocystic changes during the month. May need to refer you to surgeon as you think you may need breast reduction. That is possible and  will see how you respond to the above.  Please get opinion from gyn regarding if you need mammogram as they did breast exam 3 weeks ago and you have history of biopsies. Recommend that you get old records of imaging and biopsy for future use/imaging centers.  Follow up in 3-4 weeks or as needed  [employer], PA-C

## 2018-05-29 ENCOUNTER — Telehealth: Payer: Self-pay

## 2018-05-29 NOTE — Telephone Encounter (Signed)
Patient wants to request a mammogram be done per her visit with Mackie Pai. Patient is coming for colposcopy on Thursday and will discuss further with our provider at that visit Kathrene Alu RN

## 2018-06-01 ENCOUNTER — Ambulatory Visit: Payer: No Typology Code available for payment source | Admitting: Obstetrics & Gynecology

## 2018-06-01 ENCOUNTER — Other Ambulatory Visit (HOSPITAL_COMMUNITY)
Admission: RE | Admit: 2018-06-01 | Discharge: 2018-06-01 | Disposition: A | Discharge status: Home / Self Care | Payer: No Typology Code available for payment source | Source: Ambulatory Visit | Attending: Obstetrics & Gynecology | Admitting: Obstetrics & Gynecology

## 2018-06-01 ENCOUNTER — Encounter: Payer: Self-pay | Admitting: Obstetrics & Gynecology

## 2018-06-01 VITALS — BP 117/73 | HR 94 | Ht 69.0 in | Wt 222.1 lb

## 2018-06-01 DIAGNOSIS — R87618 Other abnormal cytological findings on specimens from cervix uteri: Secondary | ICD-10-CM | POA: Insufficient documentation

## 2018-06-01 NOTE — Patient Instructions (Signed)
Loop Electrosurgical Excision Procedure  Loop electrosurgical excision procedure (LEEP) is the cutting and removal (excision) of part of the cervix. The cervix is the bottom part of the uterus that opens into the vagina. The tissue that is removed from the cervix is then examined to see if there are precancerous cells or cancer cells present. LEEP may be done when:  · You have abnormal bleeding from your cervix.  · You have an abnormal Pap test result.  · Your health care provider finds an abnormality on your cervix during a pelvic exam.    LEEP typically only takes a few minutes and is often done in the health care provider's office. The procedure is safe for women who are trying to get pregnant. However, the procedure is usually not done during a menstrual period or during pregnancy.  Tell a health care provider about:  · Any allergies you have.  · All medicines you are taking, including vitamins, herbs, eye drops, creams, and over-the-counter medicines.  · Any problems you or family members have had with anesthetic medicines.  · Any blood disorders you have.  · Any surgeries you have had.  · Any medical conditions you have, including current or past vaginal infections, such as herpes or sexually transmitted infections (STIs).  · Whether you are pregnant or may be pregnant.  What are the risks?  Generally, this is a safe procedure. However, problems may occur, including:  · Infection.  · Bleeding.  · Allergic reactions to medicines.  · Changes or scarring in the cervix.  · Increased risk of early (preterm) labor in future pregnancies.    What happens before the procedure?  · Ask your health care provider about:  ? Changing or stopping your regular medicines. This is especially important if you are taking diabetes medicines or blood thinners.  ? Taking medicines such as aspirin and ibuprofen. These medicines can thin your blood. Do not take these medicines before your procedure if your health care provider  instructs you not to.  · Your health care provider may recommend that you take pain medicine before the procedure.  · Plan to have someone take you home after the procedure.  What happens during the procedure?  · An instrument called a speculum will be placed in your vagina. This will allow your health care provider to see the cervix.  · You will be given a medicine to numb the area (local anesthetic). The medicine will be injected into your cervix and the surrounding area.  · A solution will be applied to your cervix. This solution will help the health care provider find the abnormal cells that need to be removed.  · A thin wire loop will be passed through your vagina. The wire will be used to burn (cauterize) the cervical tissue with an electrical current.  · The abnormal cervical tissue will be removed.  · Any open blood vessels will be cauterized to prevent bleeding.  · A paste may be applied to the cauterized area of your cervix to help prevent bleeding.  · The sample of cervical tissue will be examined under a microscope.  The procedure may vary among health care providers and hospitals.  What happens after the procedure?  · You may have mild abdominal cramping.  · You may have a small amount of bleeding (spotting) from the vagina.  · You may have a dark-colored discharge coming from your vagina. This is from the paste that was used on the cervix to   prevent bleeding.  · Your health care provider may recommend pelvic rest. Pelvic rest generally means avoiding sex and not putting anything in the vagina, such as tampons, creams, and douches.  · It is your responsibility to get your test results. Ask your health care provider or the department performing the test when your results will be ready.  This information is not intended to replace advice given to you by your health care provider. Make sure you discuss any questions you have with your health care provider.  Document Released: 12/18/2002 Document Revised:  10/24/2015 Document Reviewed: 08/11/2015  Elsevier Interactive Patient Education © 2018 [employer]

## 2018-06-01 NOTE — Progress Notes (Signed)
Patient given informed consent, signed copy in the chart, time out was performed. Pt reports a h/o a prev abnormal PAP at age 32 years. S/p colpo at that time. Placed in lithotomy position. Cervix viewed with speculum and colposcope after application of acetic acid.   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 CLINICAL CORRELATION IS RECOMMENDED.Abnormal    Bacterial vaginitis POSITIVE for Gardnerella vaginalisAbnormal    Candida vaginitis Negative for Candida species   Comment: Normal Reference Range - Negative  Chlamydia Negative   Comment: Normal Reference Range - Negative  Neisseria gonorrhea Negative   Comment: Normal Reference Range - Negative  HPV DETECTEDAbnormal      Colposcopy adequate?  yes Acetowhite lesions? yes Punctation? no Mosaicism?  no Abnormal vasculature?  yes Biopsies? yes ECC? yes   Patient was given post procedure instructions.  I suspect a higher grade lesion. Will await results however, I have given pt info on LEEP. Will make appt if bx neg pt can cancel appt. She will return in 2 weeks for results as above.  Ihsan Nomura L. Harraway-Smith, M.D., Cherlynn June

## 2018-06-06 ENCOUNTER — Telehealth: Payer: Self-pay

## 2018-06-06 NOTE — Telephone Encounter (Signed)
Left message for patient to return to call to office. Kathrene Alu RN

## 2018-06-06 NOTE — Telephone Encounter (Signed)
-----   Message from Lavonia Drafts, MD sent at 06/05/2018 12:07 PM EDT ----- Please call pt. Her bx revealed high grade disease. She will need a LEEP. Please discuss and schedule.  I did suspect this and gave her most of the needed info on her visit. She may need a recap.   Thx, clh-S.

## 2018-06-06 NOTE — Telephone Encounter (Signed)
Patient returned call and made aware that her biopsies did show some high grade abnormalities. Patient made aware that she will need a LEEP. Explained LEEP procedure to her and answered her questions. Patient scheduled for LEEP 06-28-18.Kathrene Alu RN

## 2018-06-06 NOTE — Telephone Encounter (Signed)
Patient calling because she had originally mentioned breast pain to Dr. Rosana Hoes at her annual exam. Dr. Rosana Hoes referred her to PCP because she is interested in breast reduction.  Patient states she Dr. Merry Proud and her gave her some medication and exercises for her breast pain and told her to return in a few weeks. Patient requested mammogram for bilateral breast pain. But she states Dr. Merry Proud told her we would obtain the mammogram. Patient is frustrated with getting the run around. Will route to provider for input.  Kathrene Alu RN

## 2018-06-19 ENCOUNTER — Ambulatory Visit: Payer: No Typology Code available for payment source | Admitting: Medical

## 2018-06-22 ENCOUNTER — Ambulatory Visit: Payer: No Typology Code available for payment source | Admitting: Medical

## 2018-06-28 ENCOUNTER — Ambulatory Visit (INDEPENDENT_AMBULATORY_CARE_PROVIDER_SITE_OTHER): Payer: No Typology Code available for payment source | Admitting: Obstetrics & Gynecology

## 2018-06-28 ENCOUNTER — Encounter: Payer: Self-pay | Admitting: Obstetrics & Gynecology

## 2018-06-28 ENCOUNTER — Other Ambulatory Visit (HOSPITAL_COMMUNITY)
Admission: RE | Admit: 2018-06-28 | Discharge: 2018-06-28 | Disposition: A | Discharge status: Home / Self Care | Payer: No Typology Code available for payment source | Source: Ambulatory Visit | Attending: Obstetrics & Gynecology | Admitting: Obstetrics & Gynecology

## 2018-06-28 VITALS — BP 126/82 | Ht 69.0 in | Wt 221.0 lb

## 2018-06-28 DIAGNOSIS — N879 Dysplasia of cervix uteri, unspecified: Secondary | ICD-10-CM

## 2018-06-28 DIAGNOSIS — N870 Mild cervical dysplasia: Secondary | ICD-10-CM | POA: Insufficient documentation

## 2018-06-28 DIAGNOSIS — Z01818 Encounter for other preprocedural examination: Secondary | ICD-10-CM

## 2018-06-28 DIAGNOSIS — Z3202 Encounter for pregnancy test, result negative: Secondary | ICD-10-CM | POA: Diagnosis not present

## 2018-06-28 LAB — POCT URINE PREGNANCY: Preg Test, Ur: NEGATIVE

## 2018-06-28 NOTE — Addendum Note (Signed)
Addended by: Lavonia Drafts on: 06/28/2018 03:21 PM   Modules accepted: Orders

## 2018-06-28 NOTE — Progress Notes (Signed)
Pap smear and colposcopy reviewed.   Pap 05/05/2018 Satisfactory for evaluation endocervical/transformation zone component PRESENT.Abnormal     Diagnosis LOW GRADE SQUAMOUS INTRAEPITHELIAL LESION: CIN-1/ HPV (LSIL).Abnormal    Diagnosis THERE ARE A FEW CELLS SUGGESTIVE OF A HIGHER GRADE LESION. CLINICAL CORRELATION IS RECOMMENDED.Abnormal    Bacterial vaginitis POSITIVE for Gardnerella vaginalisAbnormal    Candida vaginitis Negative for Candida species   Comment: Normal Reference Range - Negative  Chlamydia Negative   Comment: Normal Reference Range - Negative  Neisseria gonorrhea Negative   Comment: Normal Reference Range - Negative  HPV DETECTEDAbnormal    Comment: Normal Reference Range - Negative  Trichomonas Negative   Comment: Normal Reference Range - NOT Detected  Material Submitted CervicoVaginal Pap [ThinPrep Imaged]Abnormal      Colpo Biopsy 06/01/2018 Diagnosis 1. 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 Endocervix, curettage - BENIGN ENDOCERVICAL EPITHELIUM - NO SQUAMOUS EPITHELIUM  Risks, benefits, alternatives, and limitations of procedure explained to patient, including pain, bleeding, infection, failure to remove abnormal tissue and failure to cure dysplasia, need for repeat procedures, damage to pelvic organs, cervical incompetence.  Role of HPV,cervical dysplasia and need for close followup was empasized. Informed written consent was obtained. All questions were answered. Time out performed.  Procedure: The patient was placed in lithotomy position and the bivalved coated speculum was placed in the patient's vagina. A grounding pad placed on the patient. Local anesthesia was administered via an intracervical block using 15cc of 2% Lidocaine with epinephrine. The suction was turned on and the Large 1X Fisher Cone Biopsy Excisor on 72 Watts of cutting current was used to excise the area of  decreased uptake and excise the entire transformation zone. Excellent hemostasis was achieved using roller ball coagulation set at 60 Watts coagulation current. Monsel's solution was then applied and excellent hemostasis was noted.  The speculum was removed from the vagina. Specimens were sent to pathology. The patient tolerated the procedure well. Post-operative instructions given to patient, including instruction to seek medical attention for persistent bright red bleeding, fever, abdominal/pelvic pain, dysuria, nausea or vomiting. She was also told about the possibility of having copious yellow to black tinged discharge. She was counseled to avoid anything in the vagina (sex/douching/tampons) for 4 weeks.  She has a 4 week post-operative check to review results and assess wound healing.   Joyce Rivers, M.D., Joyce Rivers

## 2018-06-28 NOTE — Patient Instructions (Signed)
Cervical Conization °Cervical conization (cone biopsy) is a procedure in which a cone-shaped portion of the cervix is cut out so that it can be examined under a microscope. The procedure is done to check for cancer cells or cells that might turn into cancer (precancerous cells). You may have this procedure if: °· You have abnormal bleeding from your cervix. °· You had an abnormal Pap test. °· Something abnormal was seen on your cervix during an exam. ° °This procedure is performed in either a health care provider’s office or in an operating room. °Tell a health care provider about: °· Any allergies you have. °· All medicines you are taking, including vitamins, herbs, eye drops, creams, and over-the-counter medicines. °· Any problems you or family members have had with the use of anesthetic medicines. °· Any blood disorders you have. °· Any surgeries you have had. °· Any medical conditions you have. °· Your smoking habits. °· When you normally have your period. °· Whether you are pregnant or may be pregnant. °What are the risks? °Generally, this is a safe procedure. However, problems may occur, including: °· Heavy bleeding for several days or weeks after the procedure. °· Allergic reactions to medicines or dyes. °· Increased risk of preterm labor in future pregnancies. °· Infection (rare). °· Damage to the cervix or other structures or organs (rare). ° °What happens before the procedure? °Staying hydrated °Follow instructions from your health care provider about hydration, which may include: °· Up to 2 hours before the procedure - you may continue to drink clear liquids, such as water, clear fruit juice, black coffee, and plain tea. ° °Eating and drinking restrictions °Follow instructions from your health care provider about eating and drinking, which may include: °· 8 hours before the procedure - stop eating heavy meals or foods such as meat, fried foods, or fatty foods. °· 6 hours before the procedure - stop eating  light meals or foods, such as toast or cereal. °· 6 hours before the procedure - stop drinking milk or drinks that contain milk. °· 2 hours before the procedure - stop drinking clear liquids. ° °General instructions °· Do not douche, have sex, use tampons, or use any vaginal medicines before the procedure as told by your health care provider. °· You may be asked to empty your bladder and bowel right before the procedure. °· Ask your health care provider about: °? Changing or stopping your normal medicines. This is important if you take diabetes medicines or blood thinners. °? Taking medicines such as aspirin and ibuprofen. These medicines can thin your blood. Do not take these medicines before your procedure if your doctor tells you not to. °· Plan to have someone take you home from the hospital or clinic. °What happens during the procedure? °· To reduce your risk of infection: °? Your health care team will wash or sanitize their hands. °? Your skin will be washed with soap. °? Hair may be removed from the surgical area. °· You will undress from the waist down and be given a gown to wear. °· You will lie on an examining table and put your feet in stirrups. °· An IV tube will be inserted into one of your veins. °· You will be given one or more of the following: °? A medicine to help you relax (sedative). °? A medicine to numb the area (local anesthetic). °? A medicine to make you fall asleep (general anesthetic). °? A medicine that numbs the cervix (cervical block). °·   A lubricated device called a speculum will be inserted into your vagina. It will be used to spread open the walls of the vagina so your health care provider can see the inside of the vagina and cervix better.  An instrument that has a magnifying lens and a light (colposcope) will let your health care provider examine the cervix more closely.  Your health care provider will apply a solution to your cervix. This turns abnormal areas a pale  color.  A tissue sample will be removed from the cervix using one of the following methods: ? The cold knife method. In this method, the tissue is cut out with a knife (scalpel). ? The loop electrosurgical excision procedure (LEEP) method. In this method, the tissue is cut out with a thin wire that can burn (cauterize) the tissue with an electrical current. ? Laser treatment method. In this method, the tissue is cut out and then cauterized with a laser beam to prevent bleeding.  Your health care provider will apply a paste over the biopsy areas to help control bleeding.  The tissue sample will be examined under a microscope. The procedure may vary among health care providers and hospitals. What happens after the procedure?  Your blood pressure, heart rate, breathing rate, and blood oxygen level will be monitored often until the medicines you were given have worn off.  If you were given a local anesthetic, you will rest at the clinic or hospital until you are stable and feel ready to go home.  If you were given a general anesthetic, you may be monitored for a longer period of time.  You may have some cramping.  You may have bloody discharge or light to moderate bleeding.  You may have dark discharge coming from your vagina. This is from the paste used on the cervix to prevent bleeding. Summary  Cervical conization is a procedure in which a cone-shaped portion of the cervix is cut out so that it can be examined under a microscope.  The procedure is done to check for cancer cells or cells that might turn into cancer (precancerous cells). This information is not intended to replace advice given to you by your health care provider. Make sure you discuss any questions you have with your health care provider. Document Released: 07/07/2005 Document Revised: 09/29/2016 Document Reviewed: 09/29/2016 Elsevier Interactive Patient Education  2017 [place] [employer] [facility]  Procedure, Care After Refer to this sheet in the next few weeks. These instructions provide you with information about caring for yourself after your procedure. Your health care provider may also give you more specific instructions. Your treatment has been planned according to current medical practices, but problems sometimes occur. Call your health care provider if you have any problems or questions after your procedure. What can I expect after the procedure? After the procedure, it is common to have:  Abdominal cramps that are similar to menstrual cramps. These may last for up to 1 week.  Pink-tinged or bloody vaginal discharge, including light to moderate bleeding, for 1-2 weeks.  A dark-colored vaginal discharge. This is from the paste that was applied to your cervix to control bleeding.  Follow these instructions at home: Activity  Return to your normal activities as told by your health care provider. Ask your health care provider what activities are safe for you.  Avoid strenuous physical activity for as long as told by your health care provider.  Do not lift anything that is heavier than 10 lb (  4.5 kg) until your health care provider says that it is safe. Bathing  Do not take baths, swim, or use a hot tub until your health care provider approves.  You may take showers. Lifestyle  Do not put anything in your vagina for 2 weeks after the procedure or until your health care provider says that it is okay. This includes tampons, creams, and douches.  Do not have sexual intercourse until your health care provider approves. General instructions  Take over-the-counter and prescription medicines only as told by your health care provider.  Keep all follow-up visits as told by your health care provider. This is important. Contact a health care provider if:  You have a fever or chills.  You feel unusually weak.  You have vaginal bleeding that is heavier or longer than a normal  menstrual cycle. A sign of this can be soaking a pad with blood.  You have severe pain.  You have nausea or vomiting.  You develop a bad smelling vaginal discharge. This information is not intended to replace advice given to you by your health care provider. Make sure you discuss any questions you have with your health care provider. Document Released: 06/10/2011 Document Revised: 10/24/2015 Document Reviewed: 08/11/2015 Elsevier Interactive Patient Education  2018 [employer].

## 2018-06-29 ENCOUNTER — Ambulatory Visit: Payer: No Typology Code available for payment source | Admitting: Medical

## 2018-07-05 ENCOUNTER — Telehealth: Payer: Self-pay

## 2018-07-05 NOTE — Telephone Encounter (Signed)
Attempted to reach patient by phone and phone rings busy. Kathrene Alu RN
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
## 2018-07-07 ENCOUNTER — Telehealth: Payer: Self-pay

## 2018-07-07 ENCOUNTER — Encounter: Payer: Self-pay | Admitting: Family Medicine

## 2018-07-07 ENCOUNTER — Ambulatory Visit: Payer: No Typology Code available for payment source | Admitting: Family Medicine

## 2018-07-07 VITALS — BP 118/82 | Temp 98.4°F | Ht 69.0 in | Wt 221.6 lb

## 2018-07-07 DIAGNOSIS — S3992XA Unspecified injury of lower back, initial encounter: Secondary | ICD-10-CM | POA: Diagnosis not present

## 2018-07-07 DIAGNOSIS — G8929 Other chronic pain: Secondary | ICD-10-CM | POA: Insufficient documentation

## 2018-07-07 DIAGNOSIS — M546 Pain in thoracic spine: Secondary | ICD-10-CM | POA: Diagnosis not present

## 2018-07-07 MED ORDER — NAPROXEN 500 MG PO TABS: 500.0000 mg | ORAL_TABLET | Freq: Two times a day (BID) | ORAL | 0 refills | Status: AC

## 2018-07-07 MED ORDER — CYCLOBENZAPRINE HCL 10 MG PO TABS
10.0000 mg | ORAL_TABLET | Freq: Three times a day (TID) | ORAL | 0 refills | Status: AC | PRN
Start: 2018-07-07 — End: ?
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
## 2018-07-18 ENCOUNTER — Telehealth: Payer: Self-pay

## 2018-07-18 NOTE — Telephone Encounter (Signed)
Copied from [facility] [id number]. Topic: Referral - Request >> Jul 18, 2018 12:02 PM Antonieta Iba C wrote: Reason for CRM: pt called in to request a referral to a breast surgeon per her last ov with [facility].   Please assist.

## 2018-07-21 ENCOUNTER — Telehealth: Payer: Self-pay | Admitting: Medical

## 2018-07-21 DIAGNOSIS — G8929 Other chronic pain: Secondary | ICD-10-CM

## 2018-07-21 DIAGNOSIS — M546 Pain in thoracic spine: Principal | ICD-10-CM

## 2018-07-21 NOTE — Telephone Encounter (Signed)
I did place referral to plastic surgery for evaluation of breast reduction. That appointment is pending/date not yet know. I had wanted her to follow up and she never did. Will get her to follow up. She notes hx of breast pain. She may need breast exam in our office and maybe imaging studies. Pt had work up before but still needs follow up. Please get her scheduled.

## 2018-07-21 NOTE — Telephone Encounter (Signed)
Pt's phone numbers are not working. I am getting a busy signal. Tried different phones and got the same thing. Please advise. Pt also is not active on MyChart.

## 2018-07-21 NOTE — Telephone Encounter (Signed)
Could you call and schedule appointment.

## 2018-07-25 ENCOUNTER — Encounter: Payer: Self-pay | Admitting: Medical

## 2018-07-28 ENCOUNTER — Ambulatory Visit: Payer: No Typology Code available for payment source | Admitting: Plastic Surgery

## 2018-07-28 ENCOUNTER — Encounter: Payer: Self-pay | Admitting: Plastic Surgery

## 2018-07-28 VITALS — BP 110/60 | HR 98 | Resp 14 | Ht 69.0 in | Wt 219.0 lb

## 2018-07-28 DIAGNOSIS — G8929 Other chronic pain: Secondary | ICD-10-CM | POA: Diagnosis not present

## 2018-07-28 DIAGNOSIS — M542 Cervicalgia: Secondary | ICD-10-CM

## 2018-07-28 DIAGNOSIS — M546 Pain in thoracic spine: Secondary | ICD-10-CM | POA: Diagnosis not present

## 2018-07-28 DIAGNOSIS — N62 Hypertrophy of breast: Secondary | ICD-10-CM

## 2018-07-28 NOTE — Progress Notes (Signed)
Patient ID: Joyce Rivers, female    DOB: [birth year], 32 y.o.   MRN: [id number]   Chief Complaint  Patient presents with  . Breast Problem    The patient is a 32 y.o. bf with a history of mammary hyperplasia for several years.  She has extremely large breasts causing symptoms that include the following: Back pain (upper and lower) and neck pain. She frequently pins bra cups higher on straps for better lift and relief. Notices relief when holding breast up in her hands. Shoulder straps causing grooves, pain occasionally requiring padding. Pain medication is sometimes required with motrin and tylenol.  Activities that are hindered by enlarged breasts include: Exercise, running, sit ups, and workout.  Her breasts are extremely large and fairly symmetric.  She has hyperpigmentation of the inframammary area on both sides.  The sternal to nipple distance on the right is 40 cm and the left is 40 cm.  The IMF distance is 19 cm.  She is 5 feet 9 inches tall and weighs 219 pounds.  Preoperative bra size = 42 triple DDD / G cup.  The estimated excess breast tissue to be removed at the time of surgery = 800 grams on the left and 800 grams on the right.  Mammogram history: None but will need a mammogram due to a history of breast dysfunction that required biopsy according to the patient.  She has a paternal grandmother that had breast cancer.  She is otherwise in good health.  She denies any nicotine use   Review of Systems  Constitutional: Positive for activity change.  HENT: Negative.   Eyes: Negative.   Respiratory: Negative.  Negative for choking.   Cardiovascular: Negative.  Negative for leg swelling.  Gastrointestinal: Negative.   Endocrine: Negative.   Genitourinary: Negative.   Musculoskeletal: Positive for back pain and neck pain.  Skin: Negative.  Negative for color change and wound.  Neurological: Negative.   Hematological: Negative.   Psychiatric/Behavioral: Negative.     Past  Medical History:  Diagnosis Date  . Anxiety   . Cyst, breast   . Depression    pt denies 7/6  . Infection    UTI  . Medical history non-contributory   . Pregnancy induced hypertension 2007  . Vaginal Pap smear, abnormal     Past Surgical History:  Procedure Laterality Date  . BREAST BIOPSY    . WISDOM TOOTH EXTRACTION        Current Outpatient Medications:  .  busPIRone (BUSPAR) 10 MG tablet, Take by mouth., Disp: , Rfl:  .  cyclobenzaprine (FLEXERIL) 10 MG tablet, Take 1 tablet (10 mg total) by mouth 3 (three) times daily as needed for muscle spasms., Disp: 30 tablet, Rfl: 0 .  diclofenac (VOLTAREN) 75 MG EC tablet, Take 1 tablet (75 mg total) by mouth 2 (two) times daily., Disp: 30 tablet, Rfl: 0 .  naproxen (NAPROSYN) 500 MG tablet, Take 1 tablet (500 mg total) by mouth 2 (two) times daily with a meal., Disp: 30 tablet, Rfl: 0   Objective:   Vitals:   07/28/18 1008  BP: 110/60  Pulse: 98  Resp: 14  SpO2: 99%    Physical Exam  Constitutional: She is oriented to person, place, and time. She appears well-developed and well-nourished.  HENT:  Head: Normocephalic and atraumatic.  Eyes: Pupils are equal, round, and reactive to light. EOM are normal.  Cardiovascular: Normal rate.  Pulmonary/Chest: Effort normal. No respiratory distress. She exhibits no  tenderness.  Abdominal: Soft. She exhibits no distension.  Neurological: She is alert and oriented to person, place, and time.  Skin: Skin is warm. No erythema.  Psychiatric: She has a normal mood and affect. Her behavior is normal. Judgment and thought content normal.    Assessment & Plan:  Symptomatic mammary hypertrophy  Chronic bilateral thoracic back pain  Neck pain  Recommend bilateral breast reduction.  Will need a preoperative mammogram.  Will also need notes sent from her primary care physician.  The patient is aware and will have these sent to [place].  [place], DO

## 2018-07-31 ENCOUNTER — Ambulatory Visit (INDEPENDENT_AMBULATORY_CARE_PROVIDER_SITE_OTHER): Payer: No Typology Code available for payment source | Admitting: Obstetrics & Gynecology

## 2018-07-31 ENCOUNTER — Encounter: Payer: Self-pay | Admitting: Obstetrics & Gynecology

## 2018-07-31 VITALS — BP 125/71 | HR 89 | Ht 69.0 in | Wt 222.0 lb

## 2018-07-31 DIAGNOSIS — N879 Dysplasia of cervix uteri, unspecified: Secondary | ICD-10-CM

## 2018-07-31 NOTE — Progress Notes (Signed)
History:  32 y.o. [id number] here today for post LEEP check . Pt denies problesm. She is not currently sexually active. She denies bleeding, pain or abnormal discharge.      The following portions of the patient's history were reviewed and updated as appropriate: allergies, current medications, past family history, past medical history, past social history, past surgical history and problem list.  Review of Systems:  Pertinent items are noted in HPI.    Objective:  Physical Exam Blood pressure 125/71, pulse 89, height 5\' 9"  (1.753 m), weight 222 lb (100.7 kg), last menstrual period 07/16/2018.  CONSTITUTIONAL: Well-developed, well-nourished female in no acute distress.  HENT:  Normocephalic, atraumatic EYES: Conjunctivae and EOM are normal. No scleral icterus.  NECK: Normal range of motion SKIN: Skin is warm and dry. No rash noted. Not diaphoretic.No pallor. [place]: Alert and oriented to person, place, and time. Normal coordination.  Pelvic: Normal appearing external genitalia; normal appearing vaginal mucosa and cervix.  Normal discharge.  surgical site healing well.   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    F/u in 1 year for repeat PAP or sooner prn  Chundra Sauerwein L. Harraway-Smith, M.D., Cherlynn June

## 2018-07-31 NOTE — Patient Instructions (Signed)
Cervical Dysplasia Cervical dysplasia is a condition in which a woman's cervix cells have abnormal changes. The cervix is the opening of the uterus (womb). It is located between the vagina and the uterus. Cervical dysplasia may be an early sign of cervical cancer. If left untreated, this condition may become more severe and may progress to cervical cancer. Early detection, treatment, and follow-up care are very important. What are the causes? Cervical dysplasia can be caused by a human papillomavirus (HPV) infection. HPV is the most common sexually transmitted infection (STI). HPV is spread from person to person through sexual contact. This includes oral, vaginal, or anal sex. What increases the risk? The following factors may make you more likely to develop this condition:  Having had a sexually transmitted infection (STI), such as herpes, chlamydia or HPV.  Becoming sexually active before age 37.  Having had more than one sexual partner.  Having a sexual partner who has multiple sexual partners.  Not using protection, such as a condom, during sex, especially with new sexual partners.  Having a history of cancer of the vagina or vulva.  Having a weakened body defense (immune) system.  Being the daughter of a woman who took diethylstilbestrol (DES) during pregnancy.  Having a family history of cervical cancer.  Smoking.  Using oral contraceptives, also called birth control pills.  Having had three or more full-term pregnancies.  What are the signs or symptoms? There are usually no symptoms of this condition. If you do have symptoms, they may include:  Abnormal vaginal discharge.  Bleeding between periods or after sex.  Bleeding during menopause.  Pain during sex (dyspareunia).  How is this diagnosed? A test called a Pap test may be done. During this test, cells are taken from the cervix and then examined under a microscope. A test in which tissue is removed from the cervix  (biopsy) may also be done if the Pap test is abnormal or if the cervix looks abnormal. How is this treated? Treatment varies based on the severity of the condition. Treatment may include:  Cryotherapy. During cryotherapy, the abnormal cells are frozen with a steel-tip instrument.  Loop electrosurgical excision procedure (LEEP). This procedure removes abnormal tissue from the cervix.  Surgery to remove abnormal tissue. This is usually done in more advanced cases. Surgical options include: ? A cone biopsy. This is a procedure in which the cervical canal and a portion of the center of the cervix are removed. ? Hysterectomy. This is a surgery in which the uterus and cervix are removed.  Follow these instructions at home:  Take over-the-counter and prescription medicines only as told by your health care provider.  Do not use tampons, have sex, or douche until your health care provider says it is okay.  Keep follow-up visits as told by your health care provider. This is important. Women who have been treated for cervical dysplasia should have regular pelvic exams and Pap tests as told by their health care provider. How is this prevented?  Practice safe sex to help prevent sexually transmitted infections (STI) that may cause this condition.  Have regular Pap tests. Talk with your health care provider about how often you need these tests. Pap tests will help identify cell changes that can lead to cancer. Contact a health care provider if:  You develop genital warts. Get help right away if:  You have a fever.  You have abnormal vaginal discharge.  Your menstrual period is heavier than normal.  You develop bright  red bleeding. This may include blood clots.  You have pain or cramps that get worse, and medicine does not help to relieve your pain.  You feel light-headed and you are unusually weak.  You have fainting spells.  You have pain in the abdomen. Summary  Cervical dysplasia  is a condition in which a woman's cervix cells have abnormal changes.  If left untreated, this condition may become more severe and may progress to cervical cancer.  Early detection, treatment, and follow-up care are very important in managing this condition.  Have regular pelvic exams and Pap tests. Talk with your health care provider about how often you need these tests. Pap tests will help identify cell changes that can lead to cancer. This information is not intended to replace advice given to you by your health care provider. Make sure you discuss any questions you have with your health care provider. Document Released: 09/27/2005 Document Revised: 09/30/2016 Document Reviewed: 09/30/2016 Elsevier Interactive Patient Education  2017 [employer].

## 2019-04-10 IMAGING — US US PELVIS COMPLETE TRANSABD/TRANSVAG
1 series · 13 of 25 positions shown · non-contrast
Comparison: None available.

CLINICAL DATA: Initial evaluation for pelvic pain for 1 month..
History of fibroids.



[Series 1: us pelvis complete transabd/transvag · 0.20mm/px · 13 of 71 slices shown]
[im 1/71]
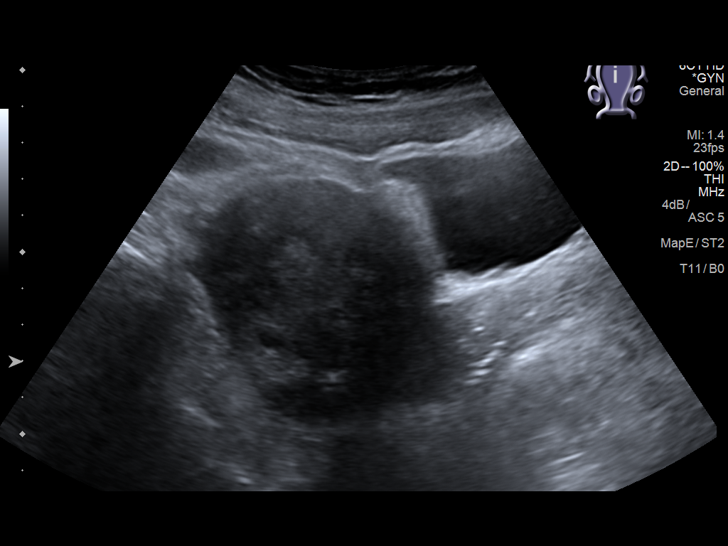
[im 6/71]
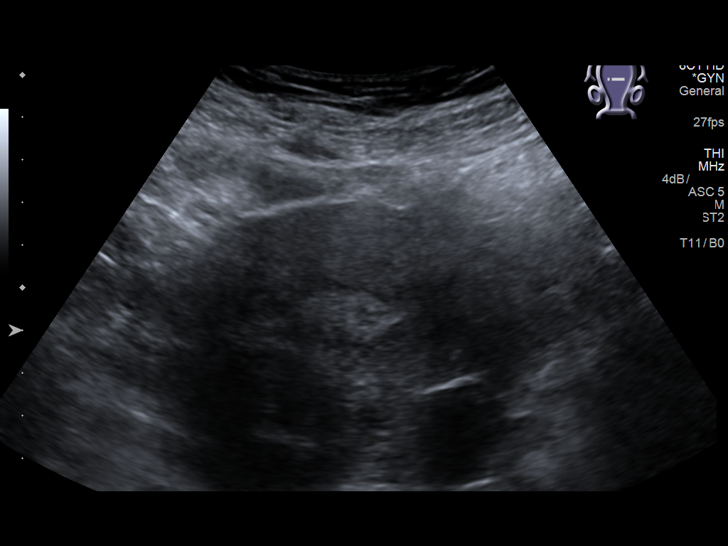
[im 12/71]
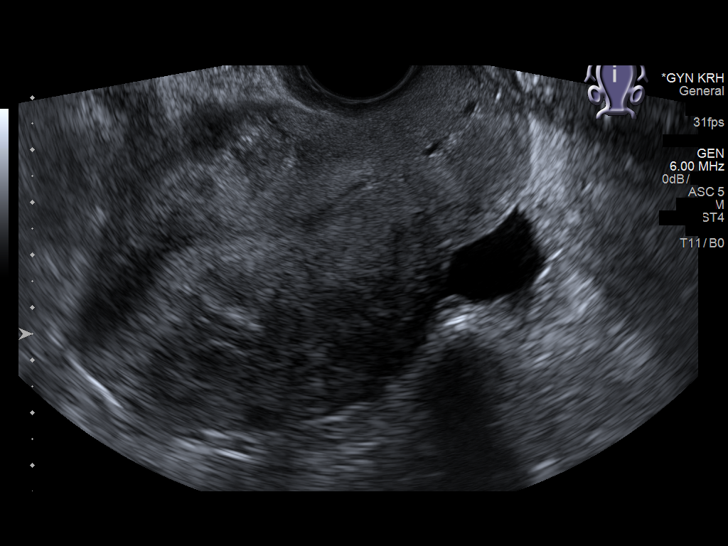
[im 18/71]
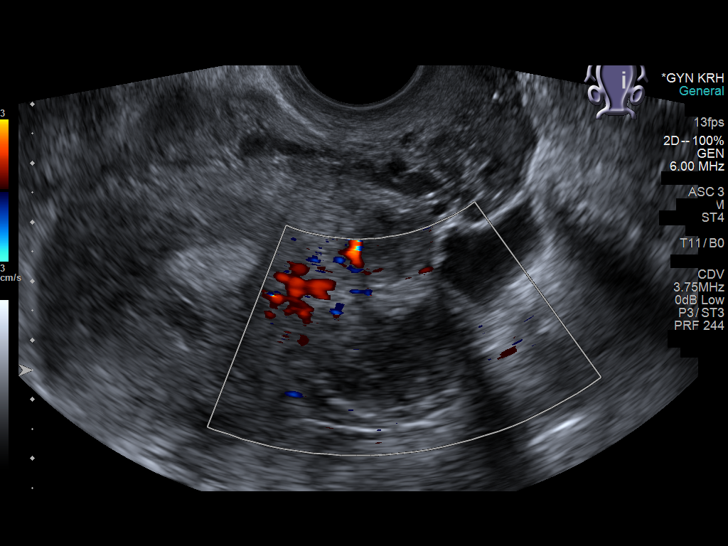
[im 24/71]
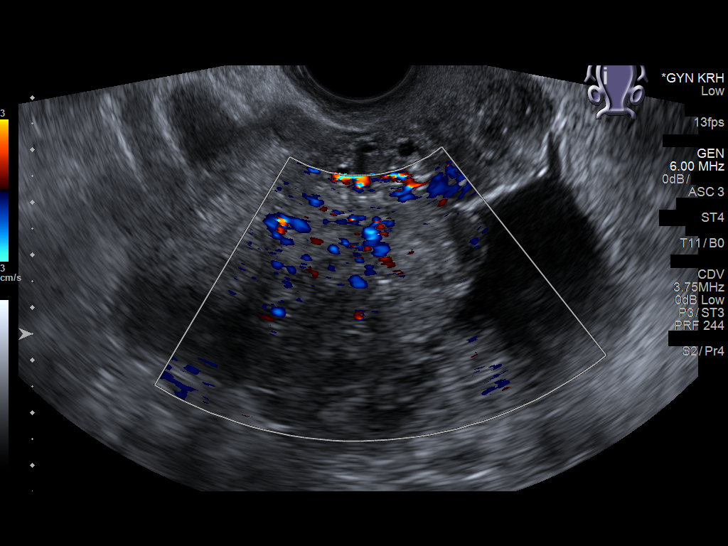
[im 30/71]
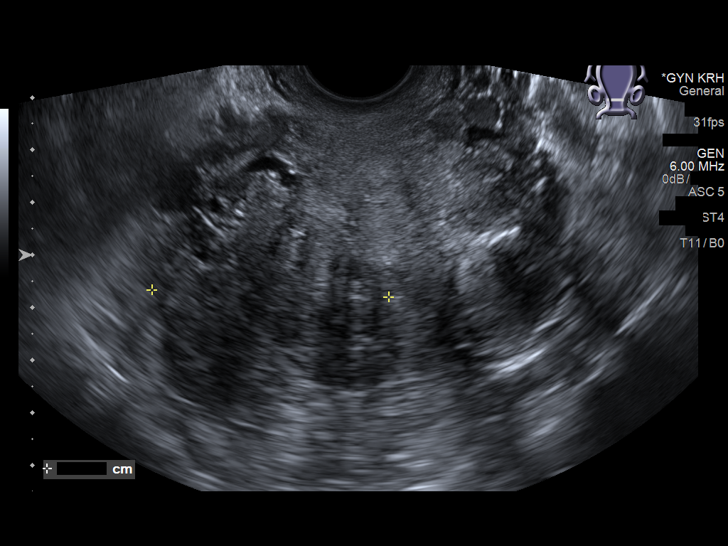
[im 36/71]
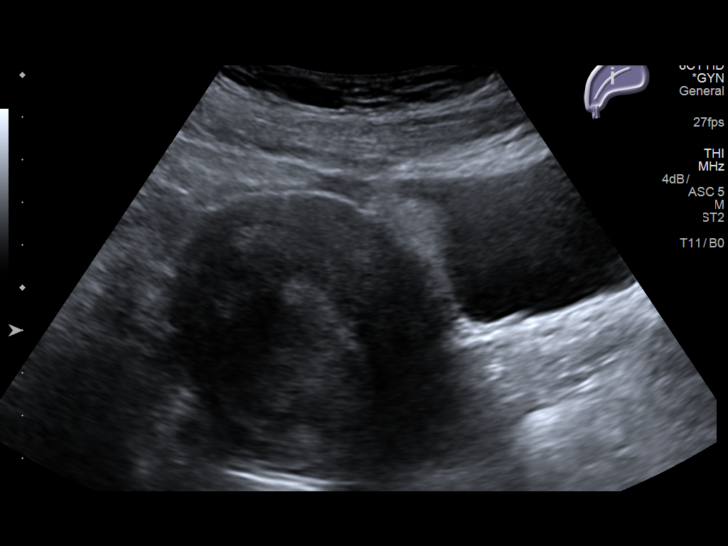
[im 41/71]
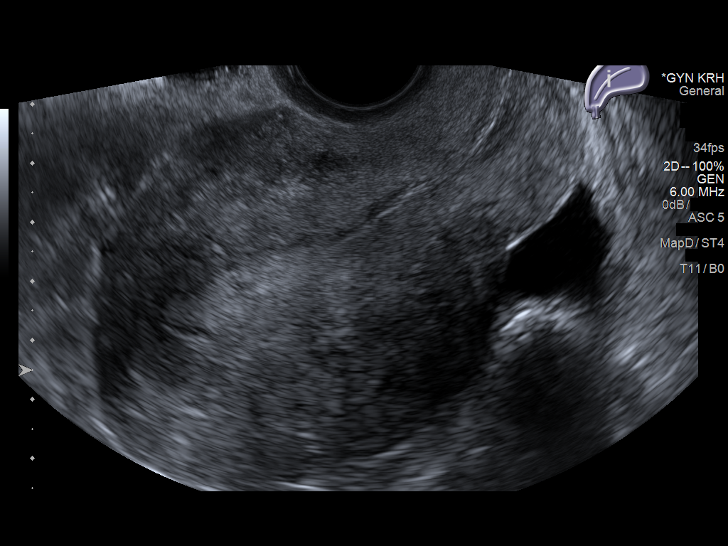
[im 47/71]
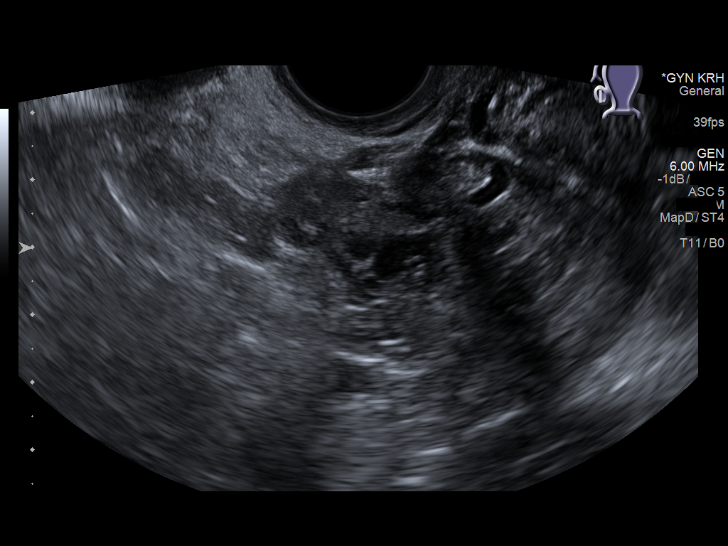
[im 53/71]
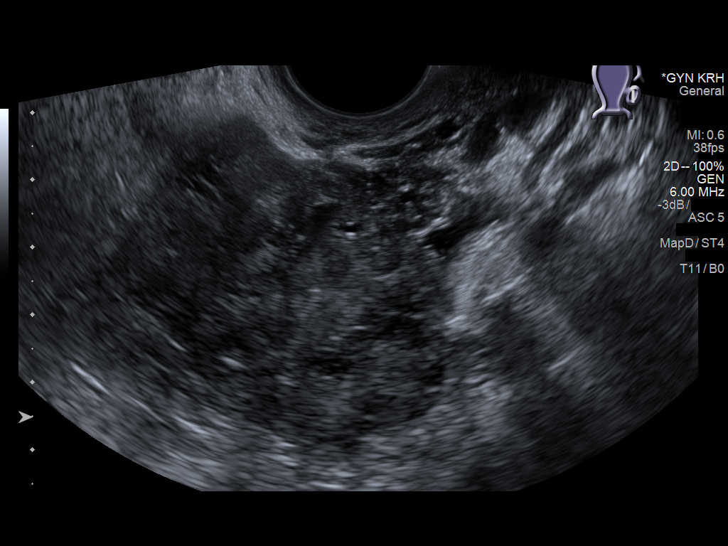
[im 59/71]
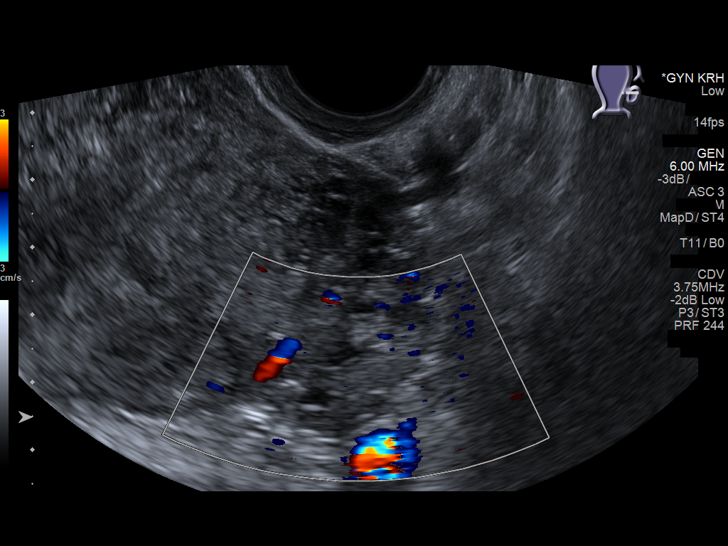
[im 65/71]
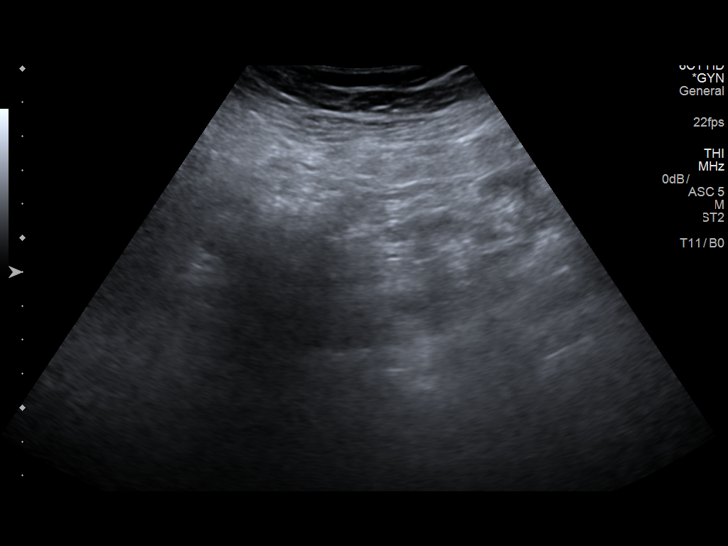
[im 71/71]
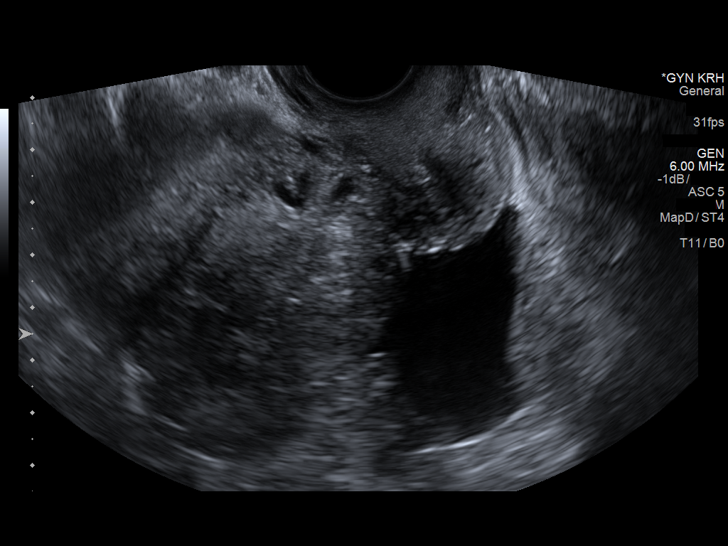

[13 of 25 positions shown; findings below may reference images not displayed]

FINDINGS: Uterus

Measurements: 9.6 x 6.0 x 7.6 cm. 2.7 x 2.2 x 2.3 cm subserosal
fibroid present at the left posterior uterine body. 5.1 x 3.6 x
cm subserosal fibroid present at the right posterior mid uterine
body.

Endometrium

Thickness: 5 mm.  No focal abnormality visualized.

Right ovary

Measurements: 2.6 x 1.5 x 1.9 cm. Normal appearance/no adnexal mass.

Left ovary

Measurements: 3.0 x 1.6 x 1.7 cm. Normal appearance/no adnexal mass.

Other findings

Moderate volume free fluid within the pelvis, likely physiologic.
IMPRESSION: 1. Fibroid uterus as above.
2. Otherwise unremarkable and normal pelvic ultrasound. No other
acute abnormality identified.

## 2019-07-30 ENCOUNTER — Ambulatory Visit (INDEPENDENT_AMBULATORY_CARE_PROVIDER_SITE_OTHER): Payer: Medicaid Other | Admitting: Obstetrics & Gynecology

## 2019-07-30 ENCOUNTER — Encounter: Payer: Self-pay | Admitting: Obstetrics & Gynecology

## 2019-07-30 ENCOUNTER — Other Ambulatory Visit: Payer: Self-pay

## 2019-07-30 ENCOUNTER — Other Ambulatory Visit (HOSPITAL_COMMUNITY)
Admission: RE | Admit: 2019-07-30 | Discharge: 2019-07-30 | Disposition: A | Discharge status: Home / Self Care | Payer: Medicaid Other | Source: Ambulatory Visit | Attending: Obstetrics & Gynecology | Admitting: Obstetrics & Gynecology

## 2019-07-30 VITALS — BP 131/81 | HR 101 | Ht 69.0 in | Wt 242.0 lb

## 2019-07-30 DIAGNOSIS — Z87410 Personal history of cervical dysplasia: Secondary | ICD-10-CM | POA: Diagnosis present

## 2019-07-30 DIAGNOSIS — A599 Trichomoniasis, unspecified: Secondary | ICD-10-CM | POA: Diagnosis not present

## 2019-07-30 DIAGNOSIS — Z0000 Encounter for general adult medical examination without abnormal findings: Secondary | ICD-10-CM | POA: Diagnosis not present

## 2019-07-30 DIAGNOSIS — Z01419 Encounter for gynecological examination (general) (routine) without abnormal findings: Secondary | ICD-10-CM

## 2019-07-30 NOTE — Addendum Note (Signed)
Addended by: Phill Myron on: 07/30/2019 10:28 AM   Modules accepted: Orders

## 2019-07-30 NOTE — Patient Instructions (Signed)
Abdominal Pain, Adult Abdominal pain can be caused by many things. Often, abdominal pain is not serious and it gets better with no treatment or by being treated at home. However, sometimes abdominal pain is serious. Your health care provider will do a medical history and a physical exam to try to determine the cause of your abdominal pain. Follow these instructions at home:  Take over-the-counter and prescription medicines only as told by your health care provider. Do not take a laxative unless told by your health care provider.  Drink enough fluid to keep your urine clear or pale yellow.  Watch your condition for any changes.  Keep all follow-up visits as told by your health care provider. This is important. Contact a health care provider if:  Your abdominal pain changes or gets worse.  You are not hungry or you lose weight without trying.  You are constipated or have diarrhea for more than 2-3 days.  You have pain when you urinate or have a bowel movement.  Your abdominal pain wakes you up at night.  Your pain gets worse with meals, after eating, or with certain foods.  You are throwing up and cannot keep anything down.  You have a fever. Get help right away if:  Your pain does not go away as soon as your health care provider told you to expect.  You cannot stop throwing up.  Your pain is only in areas of the abdomen, such as the right side or the left lower portion of the abdomen.  You have bloody or black stools, or stools that look like tar.  You have severe pain, cramping, or bloating in your abdomen.  You have signs of dehydration, such as: ? Dark urine, very little urine, or no urine. ? Cracked lips. ? Dry mouth. ? Sunken eyes. ? Sleepiness. ? Weakness. This information is not intended to replace advice given to you by your health care provider. Make sure you discuss any questions you have with your health care provider. Document Released: 07/07/2005 Document  Revised: 04/16/2016 Document Reviewed: 03/10/2016 Elsevier Interactive Patient Education  2020 [employer]  

## 2019-07-30 NOTE — Progress Notes (Signed)
Subjective:     Joyce Rivers is a 33 y.o. female here for a routine exam.  Current complaints: pt reports occ pain in her lower abd. It has occurred on both sides. It is intermittent and fleeting. Does not limit usual activities.     Pt recently quit her job because of [place] related concerns. She is looking for a safer alternative.    Gynecologic History Patient's last menstrual period was 07/03/2019. Contraception: abstinence Last Pap: LGSIL 05/05/2018. 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  Obstetric History OB History  Gravida Para Term Preterm AB Living  4 2 1   2 1   SAB TAB Ectopic Multiple Live Births  2     0 1    # Outcome Date GA Lbr Len/2nd Weight Sex Delivery Anes PTL Lv  4 Para 06/10/15 [redacted]w[redacted]d 03:50 / 00:03 7.4 oz (0.21 kg) M Vag-Spont None  FD  3 SAB 2013 [redacted]w[redacted]d         2 SAB 2011 [redacted]w[redacted]d         1 Term 07/20/06 [redacted]w[redacted]d  7 lb (3.175 kg) F Vag-Spont EPI N LIV   The following portions of the patient's history were reviewed and updated as appropriate: allergies, current medications, past family history, past medical history, past social history, past surgical history and problem list.  Review of Systems Pertinent items are noted in HPI.    Objective:  BP 131/81   Pulse (!) 101   Ht 5\' 9"  (1.753 m)   Wt 242 lb (109.8 kg)   LMP 07/03/2019   BMI 35.74 kg/m   General Appearance:    Alert, cooperative, no distress, appears stated age  Head:    Normocephalic, without obvious abnormality, atraumatic  Eyes:    conjunctiva/corneas clear, EOM's intact, both eyes  Ears:    Normal external ear canals, both ears  Nose:   Nares normal, septum midline, mucosa normal, no drainage    or sinus tenderness  Throat:   Lips, mucosa, and tongue normal; teeth and gums normal  Neck:   Supple, symmetrical,  trachea midline, no adenopathy;    thyroid:  no enlargement/tenderness/nodules  Back:     Symmetric, no curvature, ROM normal, no CVA tenderness  Lungs:     respirations unlabored  Chest Wall:    No tenderness or deformity   Heart:    Regular rate and rhythm  Breast Exam:    No tenderness, masses, or nipple abnormality  Abdomen:     Soft, non-tender, bowel sounds active all four quadrants,    no masses, no organomegaly  Genitalia:    Normal female without lesion, discharge or tenderness     Extremities:   Extremities normal, atraumatic, no cyanosis or edema  Pulses:   2+ and symmetric all extremities  Skin:   Skin color, texture, turgor normal, no rashes or lesions    Assessment:    Healthy female exam.   H/o abnormal PAP. S/p LEEP 2019 abd pain- will observe for now. Pt to f/u if sx worsen or persist.     Plan:   F/u PAP with hrHPV F/u in 1 year or sooner prn Pt has primary care provider  Cai Flott L. Harraway-Smith, M.D., Cherlynn June

## 2019-08-02 LAB — CYTOLOGY - PAP
Comment: NEGATIVE
Diagnosis: NEGATIVE
High risk HPV: NEGATIVE

## 2019-08-03 ENCOUNTER — Other Ambulatory Visit: Payer: Self-pay | Admitting: Obstetrics & Gynecology

## 2019-08-03 MED ORDER — METRONIDAZOLE 500 MG PO TABS: ORAL_TABLET | ORAL | 0 refills | Status: DC

## 2019-08-03 NOTE — Addendum Note (Signed)
Addended by: Lavonia Drafts on: 08/03/2019 11:45 AM   Modules accepted: Orders

## 2019-08-06 NOTE — Progress Notes (Signed)
Called patient at number listed and "wrong number". Kathrene Alu RN   Letter sent to patients address as we seem to have wrong number for patient. Kathrene Alu RN

## 2019-08-14 ENCOUNTER — Telehealth: Payer: Self-pay

## 2019-08-14 NOTE — Telephone Encounter (Signed)
Pt called the office requesting PAP results. Pt made aware that she tested positive for Trich.Pt advised to not have sex for ten days after she and partner are treated. Understanding was voiced. Medication was sent to pharmacy. Irania Durell l Yatziri Wainwright, CMA

## 2020-03-06 ENCOUNTER — Ambulatory Visit: Payer: Medicaid Other | Admitting: Obstetrics & Gynecology

## 2020-03-20 ENCOUNTER — Other Ambulatory Visit: Payer: Self-pay

## 2020-03-20 ENCOUNTER — Ambulatory Visit (INDEPENDENT_AMBULATORY_CARE_PROVIDER_SITE_OTHER): Payer: Medicaid Other | Admitting: Family Medicine

## 2020-03-20 ENCOUNTER — Other Ambulatory Visit (HOSPITAL_COMMUNITY)
Admission: RE | Admit: 2020-03-20 | Discharge: 2020-03-20 | Disposition: A | Discharge status: Home / Self Care | Payer: Medicaid Other | Source: Ambulatory Visit | Attending: Obstetrics & Gynecology | Admitting: Obstetrics & Gynecology

## 2020-03-20 ENCOUNTER — Encounter: Payer: Self-pay | Admitting: Family Medicine

## 2020-03-20 VITALS — BP 130/79 | HR 86 | Wt 251.0 lb

## 2020-03-20 DIAGNOSIS — N898 Other specified noninflammatory disorders of vagina: Secondary | ICD-10-CM

## 2020-03-20 MED ORDER — FLUCONAZOLE 150 MG PO TABS: 150.0000 mg | ORAL_TABLET | Freq: Once | ORAL | 0 refills | Status: AC

## 2020-03-20 NOTE — Progress Notes (Signed)
   GYNECOLOGY OFFICE VISIT NOTE  History:   Joyce Rivers is a 34 y.o. [phone] here today for vaginal itching.  Last seen 07/2019 Normal pap at that time, but also +trich Today reports significant vaginal itching, both internal and external No real discharge Some burning with urination Has had UTI in the past but none recently Has been taking Penicillin recently due to tooth extraction with abscess Has not noticed any rashes Reports she took full treatment for prior trich infection and has not had any sexual partners since then   Past Medical History:  Diagnosis Date  . Anxiety   . Cyst, breast   . Depression    pt denies 7/6  . Infection    UTI  . Medical history non-contributory   . Pregnancy induced hypertension 2007  . Vaginal Pap smear, abnormal     Past Surgical History:  Procedure Laterality Date  . BREAST BIOPSY    . WISDOM TOOTH EXTRACTION      The following portions of the patient's history were reviewed and updated as appropriate: allergies, current medications, past family history, past medical history, past social history, past surgical history and problem list.   Health Maintenance:  Normal pap and negative HRHPV on 07/30/2019.  Not yet due for mammogram.   Review of Systems:  Pertinent items noted in HPI and remainder of comprehensive ROS otherwise negative.  Physical Exam:  BP 130/79   Pulse 86   Wt 251 lb (113.9 kg)   LMP 03/06/2020 (Within Days)   BMI 37.07 kg/m  CONSTITUTIONAL: Well-developed, well-nourished female in no acute distress.  HEENT:  Normocephalic, atraumatic.Marland Kitchen  NECK: Normal range of motion, supple, no masses noted on observation SKIN: No rash noted. Not diaphoretic. No erythema. No pallor. MUSCULOSKELETAL: Normal range of motion. No edema noted. NEUROLOGIC: Alert and oriented to person, place, and time. Normal muscle tone coordination.  PSYCHIATRIC: Normal mood and affect. Normal behavior. Normal judgment and thought  content. RESPIRATORY: Effort normal, no problems with respiration noted PELVIC: External genitalia normal without rashes or lesions, declined speculum exam  Labs and Imaging No results found for this or any previous visit (from the past 168 hour(s)). No results found.    Assessment and Plan:   Problem List Items Addressed This Visit    None    Visit Diagnoses    Vaginal discharge    -  Primary   Relevant Orders   Cervicovaginal ancillary only( [place])   Vaginal itching       Relevant Orders   Cervicovaginal ancillary only( [place])     #Vaginitis Likely yeast vaginitis based on symptoms, empiric treatment sent to pharmacy and aptima swab obtained for yeast/BV. No rashes or other lesions to suggest herpes. UA unremarkable. For financial reasons she declined other testing, has not had any sexual partners since diagnosis of trich so this is reasonable. Will follow up by phone if swab results with +BV.   Routine preventative health maintenance measures emphasized. Please refer to After Visit Summary for other counseling recommendations.   Return if symptoms worsen or fail to improve.    Total face-to-face time with patient: 10 minutes.  Over 50% of encounter was spent on counseling and coordination of care.   Augustin Coupe, [place] for [employer], [place]

## 2020-03-20 NOTE — Progress Notes (Signed)
Patient is having some discharge with itching.

## 2020-03-21 LAB — CERVICOVAGINAL ANCILLARY ONLY
Bacterial Vaginitis (gardnerella): POSITIVE — AB
Candida Glabrata: NEGATIVE
Candida Vaginitis: NEGATIVE
Comment: NEGATIVE
Comment: NEGATIVE
Comment: NEGATIVE

## 2020-03-22 ENCOUNTER — Telehealth: Payer: Self-pay | Admitting: Family Medicine

## 2020-03-22 MED ORDER — METRONIDAZOLE 500 MG PO TABS: 500.0000 mg | ORAL_TABLET | Freq: Two times a day (BID) | ORAL | 0 refills | Status: AC

## 2020-03-22 NOTE — Telephone Encounter (Signed)
Patient's swab with +BV, attempted to call but no answer. Of note, appears to have two phone #'s in chart that are very similar, I believe Home # is correct while mobile # is incorrect.   Please call patient and inform her of result, I have sent rx to her pharmacy.

## 2020-09-18 ENCOUNTER — Encounter: Payer: Self-pay | Admitting: Family Medicine

## 2020-09-18 ENCOUNTER — Other Ambulatory Visit: Payer: Self-pay

## 2020-09-18 ENCOUNTER — Ambulatory Visit (INDEPENDENT_AMBULATORY_CARE_PROVIDER_SITE_OTHER): Payer: Self-pay | Admitting: Family Medicine

## 2020-09-18 VITALS — BP 134/85 | HR 91 | Wt 243.0 lb

## 2020-09-18 DIAGNOSIS — N939 Abnormal uterine and vaginal bleeding, unspecified: Secondary | ICD-10-CM

## 2020-09-18 DIAGNOSIS — D259 Leiomyoma of uterus, unspecified: Secondary | ICD-10-CM

## 2020-09-18 NOTE — Progress Notes (Signed)
   Subjective:    Patient ID: Joyce Rivers, female    DOB: 1986-10-10, 34 y.o.   MRN: [id number]  HPI Seen for AUB. Had Menses 11/18-26, then started 12/6. Hasn't had this before. Not sexually active. Was told she has fibroid uterus.   Also gets pelvic pain every month around ovulation.   Review of Systems     Objective:   Physical Exam Vitals reviewed.  Constitutional:      Appearance: Normal appearance.  Cardiovascular:     Pulses: Normal pulses.     Heart sounds: Normal heart sounds.  Pulmonary:     Effort: Pulmonary effort is normal.  Abdominal:     General: Abdomen is flat.     Palpations: Abdomen is soft.  Neurological:     Mental Status: She is alert.  Psychiatric:        Mood and Affect: Mood normal.        Behavior: Behavior normal.        Thought Content: Thought content normal.        Judgment: Judgment normal.       Assessment & Plan:  1. Abnormal uterine bleeding (AUB) Check [place]. Possible adherent cycle. Will continue to track for now and return in 3 months to see if persistent. Discussed treatment options: [facility] would be first choice. - US PELVIS TRANSVAGINAL NON-OB (TV ONLY); Future  2. Uterine leiomyoma, unspecified location

## 2020-09-24 ENCOUNTER — Ambulatory Visit (HOSPITAL_BASED_OUTPATIENT_CLINIC_OR_DEPARTMENT_OTHER): Payer: Self-pay

## 2020-09-29 ENCOUNTER — Other Ambulatory Visit: Payer: Self-pay | Admitting: Family Medicine

## 2020-09-29 ENCOUNTER — Telehealth: Payer: Self-pay

## 2020-09-29 MED ORDER — NORETHIN ACE-ETH ESTRAD-FE 1-20 MG-MCG(24) PO TABS: 1.0000 | ORAL_TABLET | Freq: Every day | ORAL | 3 refills | Status: AC

## 2020-09-29 NOTE — Telephone Encounter (Signed)
Pt called stating she would like a Rx of OCP to help with pain and heavy bleeding. Pt made aware that a message will be sent to the provider. Understanding was voiced. [name] l [name], CMA

## 2020-12-15 ENCOUNTER — Ambulatory Visit: Payer: Medicaid Other | Admitting: Obstetrics & Gynecology
